# Patient Record
Sex: Male | Born: 1960 | Race: White | Hispanic: No | Marital: Married | Smoking: Former smoker
Health system: Southern US, Community
[De-identification: ages and names within clinical notes are randomized; demographics above are authoritative.]

## PROBLEM LIST (undated history)

## (undated) DIAGNOSIS — I471 Supraventricular tachycardia, unspecified: Secondary | ICD-10-CM

## (undated) DIAGNOSIS — Z973 Presence of spectacles and contact lenses: Secondary | ICD-10-CM

## (undated) DIAGNOSIS — M1990 Unspecified osteoarthritis, unspecified site: Secondary | ICD-10-CM

## (undated) DIAGNOSIS — K635 Polyp of colon: Secondary | ICD-10-CM

## (undated) DIAGNOSIS — J45909 Unspecified asthma, uncomplicated: Secondary | ICD-10-CM

## (undated) DIAGNOSIS — C4490 Unspecified malignant neoplasm of skin, unspecified: Secondary | ICD-10-CM

## (undated) DIAGNOSIS — T7840XA Allergy, unspecified, initial encounter: Secondary | ICD-10-CM

## (undated) HISTORY — DX: Allergy, unspecified, initial encounter: T78.40XA

## (undated) HISTORY — PX: COLONOSCOPY: SHX174

## (undated) HISTORY — DX: Polyp of colon: K63.5

## (undated) HISTORY — PX: CARDIAC CATHETERIZATION: SHX172

---

## 2011-02-11 ENCOUNTER — Ambulatory Visit: Payer: Self-pay | Admitting: Unknown Physician Specialty

## 2016-02-26 DIAGNOSIS — M545 Low back pain: Secondary | ICD-10-CM | POA: Diagnosis not present

## 2016-02-26 DIAGNOSIS — J4520 Mild intermittent asthma, uncomplicated: Secondary | ICD-10-CM | POA: Diagnosis not present

## 2016-02-26 DIAGNOSIS — L989 Disorder of the skin and subcutaneous tissue, unspecified: Secondary | ICD-10-CM | POA: Diagnosis not present

## 2016-04-09 DIAGNOSIS — D485 Neoplasm of uncertain behavior of skin: Secondary | ICD-10-CM | POA: Diagnosis not present

## 2016-04-09 DIAGNOSIS — D225 Melanocytic nevi of trunk: Secondary | ICD-10-CM | POA: Diagnosis not present

## 2016-04-13 DIAGNOSIS — C4490 Unspecified malignant neoplasm of skin, unspecified: Secondary | ICD-10-CM

## 2016-04-13 HISTORY — DX: Unspecified malignant neoplasm of skin, unspecified: C44.90

## 2016-05-12 DIAGNOSIS — L908 Other atrophic disorders of skin: Secondary | ICD-10-CM | POA: Diagnosis not present

## 2016-05-12 DIAGNOSIS — L578 Other skin changes due to chronic exposure to nonionizing radiation: Secondary | ICD-10-CM | POA: Diagnosis not present

## 2016-05-12 DIAGNOSIS — C4441 Basal cell carcinoma of skin of scalp and neck: Secondary | ICD-10-CM | POA: Diagnosis not present

## 2016-09-01 DIAGNOSIS — L918 Other hypertrophic disorders of the skin: Secondary | ICD-10-CM | POA: Diagnosis not present

## 2016-09-01 DIAGNOSIS — Z85828 Personal history of other malignant neoplasm of skin: Secondary | ICD-10-CM | POA: Diagnosis not present

## 2016-09-01 DIAGNOSIS — D225 Melanocytic nevi of trunk: Secondary | ICD-10-CM | POA: Diagnosis not present

## 2016-10-06 DIAGNOSIS — Z800 Family history of malignant neoplasm of digestive organs: Secondary | ICD-10-CM | POA: Diagnosis not present

## 2016-10-06 DIAGNOSIS — Z86010 Personal history of colonic polyps: Secondary | ICD-10-CM | POA: Diagnosis not present

## 2016-10-13 DIAGNOSIS — R220 Localized swelling, mass and lump, head: Secondary | ICD-10-CM | POA: Diagnosis not present

## 2016-10-23 DIAGNOSIS — J339 Nasal polyp, unspecified: Secondary | ICD-10-CM | POA: Diagnosis not present

## 2016-10-23 DIAGNOSIS — J329 Chronic sinusitis, unspecified: Secondary | ICD-10-CM | POA: Diagnosis not present

## 2016-10-23 DIAGNOSIS — J3489 Other specified disorders of nose and nasal sinuses: Secondary | ICD-10-CM | POA: Diagnosis not present

## 2016-11-03 ENCOUNTER — Encounter: Payer: Self-pay | Admitting: *Deleted

## 2016-11-05 DIAGNOSIS — K648 Other hemorrhoids: Secondary | ICD-10-CM | POA: Diagnosis not present

## 2016-11-05 DIAGNOSIS — K635 Polyp of colon: Secondary | ICD-10-CM | POA: Diagnosis not present

## 2016-11-05 DIAGNOSIS — D126 Benign neoplasm of colon, unspecified: Secondary | ICD-10-CM | POA: Diagnosis not present

## 2016-11-05 DIAGNOSIS — K5730 Diverticulosis of large intestine without perforation or abscess without bleeding: Secondary | ICD-10-CM | POA: Diagnosis not present

## 2016-11-05 DIAGNOSIS — Z86010 Personal history of colonic polyps: Secondary | ICD-10-CM | POA: Diagnosis not present

## 2016-11-06 NOTE — Discharge Instructions (Signed)
[facility] °[facility] °ENDOSCOPIC SINUS SURGERY °[facility] ° °What is Functional Endoscopic Sinus Surgery? ° The Surgery involves making the natural openings of the sinuses larger by removing the bony partitions that separate the sinuses from the nasal cavity.  The natural sinus lining is preserved as much as possible to allow the sinuses to resume normal function after the surgery.  In some patients nasal polyps (excessively swollen lining of the sinuses) may be removed to relieve obstruction of the sinus openings.  The surgery is performed through the nose using lighted scopes, which eliminates the need for incisions on the face.  A septoplasty is a different procedure which is sometimes performed with sinus surgery.  It involves straightening the boy partition that separates the two sides of your nose.  A crooked or deviated septum may need repair if is obstructing the sinuses or nasal airflow.  Turbinate reduction is also often performed during sinus surgery.  The turbinates are bony proturberances from the side walls of the nose which swell and can obstruct the nose in patients with sinus and allergy problems.  Their size can be surgically reduced to help relieve nasal obstruction. ° °What Can Sinus Surgery Do For Me? ° Sinus surgery can reduce the frequency of sinus infections requiring antibiotic treatment.  This can provide improvement in nasal congestion, post-nasal drainage, facial pressure and nasal obstruction.  Surgery will NOT prevent you from ever having an infection again, so it usually only for patients who get infections 4 or more times yearly requiring antibiotics, or for infections that do not clear with antibiotics.  It will not cure nasal allergies, so patients with allergies may still require medication to treat their allergies after surgery. Surgery may improve headaches related to sinusitis, however, some people will continue to  require medication to control sinus headaches related to allergies.  Surgery will do nothing for other forms of headache (migraine, tension or cluster). ° °What Are the Risks of Endoscopic Sinus Surgery? ° Current techniques allow surgery to be performed safely with little risk, however, there are rare complications that patients should be aware of.  Because the sinuses are located around the eyes, there is risk of eye injury, including blindness, though again, this would be quite rare. This is usually a result of bleeding behind the eye during surgery, which puts the vision oat risk, though there are treatments to protect the vision and prevent permanent disrupted by surgery causing a leak of the spinal fluid that surrounds the brain.  More serious complications would include bleeding inside the brain cavity or damage to the brain.  Again, all of these complications are uncommon, and spinal fluid leaks can be safely managed surgically if they occur.  The most common complication of sinus surgery is bleeding from the nose, which may require packing or cauterization of the nose.  Continued sinus have polyps may experience recurrence of the polyps requiring revision surgery.  Alterations of sense of smell or injury to the tear ducts are also rare complications.  ° °What is the Surgery Like, and what is the Recovery? ° The Surgery usually takes a couple of hours to perform, and is usually performed under a general anesthetic (completely asleep).  Patients are usually discharged home after a couple of hours.  Sometimes during surgery it is necessary to pack the nose to control bleeding, and the packing is left in place for 24 - 48 hours, and removed by your surgeon.    If a septoplasty was performed during the procedure, there is often a splint placed which must be removed after 5-7 days.   °Discomfort: Pain is usually mild to moderate, and can be controlled by prescription pain medication or acetaminophen (Tylenol).   Aspirin, Ibuprofen (Advil, Motrin), or Naprosyn (Aleve) should be avoided, as they can cause increased bleeding.  Most patients feel sinus pressure like they have a bad head cold for several days.  Sleeping with your head elevated can help reduce swelling and facial pressure, as can ice packs over the face.  A humidifier may be helpful to keep the mucous and blood from drying in the nose.  ° °Diet: There are no specific diet restrictions, however, you should generally start with clear liquids and a light diet of bland foods because the anesthetic can cause some nausea.  Advance your diet depending on how your stomach feels.  Taking your pain medication with food will often help reduce stomach upset which pain medications can cause. ° °Nasal Saline Irrigation: It is important to remove blood clots and dried mucous from the nose as it is healing.  This is done by having you irrigate the nose at least 3 - 4 times daily with a salt water solution.  We recommend using NeilMed Sinus Rinse (available at the drug store).  Fill the squeeze bottle with the solution, bend over a sink, and insert the tip of the squeeze bottle into the nose ½ of an inch.  Point the tip of the squeeze bottle towards the inside corner of the eye on the same side your irrigating.  Squeeze the bottle and gently irrigate the nose.  If you bend forward as you do this, most of the fluid will flow back out of the nose, instead of down your throat.   The solution should be warm, near body temperature, when you irrigate.   Each time you irrigate, you should use a full squeeze bottle.  ° °Note that if you are instructed to use Nasal Steroid Sprays at any time after your surgery, irrigate with saline BEFORE using the steroid spray, so you do not wash it all out of the nose. °Another product, Nasal Saline Gel (such as AYR Nasal Saline Gel) can be applied in each nostril 3 - 4 times daily to moisture the nose and reduce scabbing or crusting. ° °Bleeding:   Bloody drainage from the nose can be expected for several days, and patients are instructed to irrigate their nose frequently with salt water to help remove mucous and blood clots.  The drainage may be dark red or brown, though some fresh blood may be seen intermittently, especially after irrigation.  Do not blow you nose, as bleeding may occur. If you must sneeze, keep your mouth open to allow air to escape through your mouth. ° °If heavy bleeding occurs: Irrigate the nose with saline to rinse out clots, then spray the nose 3 - 4 times with Afrin Nasal Decongestant Spray.  The spray will constrict the blood vessels to slow bleeding.  Pinch the lower half of your nose shut to apply pressure, and lay down with your head elevated.  Ice packs over the nose may help as well. If bleeding persists despite these measures, you should notify your doctor.  Do not use the Afrin routinely to control nasal congestion after surgery, as it can result in worsening congestion and may affect healing.  ° ° ° °Activity: Return to work varies among patients. Most patients will be   out of work at least 5 - 7 days to recover.  Patient may return to work after they are off of narcotic pain medication, and feeling well enough to perform the functions of their job.  Patients must avoid heavy lifting (over 10 pounds) or strenuous physical for 2 weeks after surgery, so your employer may need to assign you to light duty, or keep you out of work longer if light duty is not possible.  NOTE: you should not drive, operate dangerous machinery, do any mentally demanding tasks or make any important legal or financial decisions while on narcotic pain medication and recovering from the general anesthetic.  °  °Call Your Doctor Immediately if You Have Any of the Following: °1. Bleeding that you cannot control with the above measures °2. Loss of vision, double vision, bulging of the eye or black eyes. °3. Fever over 101 degrees °4. Neck stiffness with  severe headache, fever, nausea and change in mental state. °You are always encourage to call anytime with concerns, however, please call with requests for pain medication refills during office hours. ° °Office Endoscopy: During follow-up visits your doctor will remove any packing or splints that may have been placed and evaluate and clean your sinuses endoscopically.  Topical anesthetic will be used to make this as comfortable as possible, though you may want to take your pain medication prior to the visit.  How often this will need to be done varies from patient to patient.  After complete recovery from the surgery, you may need follow-up endoscopy from time to time, particularly if there is concern of recurrent infection or nasal polyps. ° ° °General Anesthesia, Adult, Care After °These instructions provide you with information about caring for yourself after your procedure. Your health care provider may also give you more specific instructions. Your treatment has been planned according to current medical practices, but problems sometimes occur. Call your health care provider if you have any problems or questions after your procedure. °What can I expect after the procedure? °After the procedure, it is common to have: °· Vomiting. °· A sore throat. °· Mental slowness. ° °It is common to feel: °· Nauseous. °· Cold or shivery. °· Sleepy. °· Tired. °· Sore or achy, even in parts of your body where you did not have surgery. ° °Follow these instructions at home: °For at least 24 hours after the procedure: °· Do not: °? Participate in activities where you could fall or become injured. °? Drive. °? Use heavy machinery. °? Drink alcohol. °? Take sleeping pills or medicines that cause drowsiness. °? Make important decisions or sign legal documents. °? Take care of children on your own. °· Rest. °Eating and drinking °· If you vomit, drink water, juice, or soup when you can drink without vomiting. °· Drink enough fluid to  keep your urine clear or pale yellow. °· Make sure you have little or no nausea before eating solid foods. °· Follow the diet recommended by your health care provider. °General instructions °· Have a responsible adult stay with you until you are awake and alert. °· Return to your normal activities as told by your health care provider. Ask your health care provider what activities are safe for you. °· Take over-the-counter and prescription medicines only as told by your health care provider. °· If you smoke, do not smoke without supervision. °· Keep all follow-up visits as told by your health care provider. This is important. °Contact a health care provider if: °· You   continue to have nausea or vomiting at home, and medicines are not helpful. °· You cannot drink fluids or start eating again. °· You cannot urinate after 8-12 hours. °· You develop a skin rash. °· You have fever. °· You have increasing redness at the site of your procedure. °Get help right away if: °· You have difficulty breathing. °· You have chest pain. °· You have unexpected bleeding. °· You feel that you are having a life-threatening or urgent problem. °This information is not intended to replace advice given to you by your health care provider. Make sure you discuss any questions you have with your health care provider. °Document Released: 04/06/2000 Document Revised: 06/03/2015 Document Reviewed: 12/13/2014 °Elsevier Interactive Patient Education © 2018 [employer] ° °

## 2016-11-10 ENCOUNTER — Ambulatory Visit: Payer: 59 | Admitting: Anesthesiology

## 2016-11-10 ENCOUNTER — Ambulatory Visit
Admission: RE | Admit: 2016-11-10 | Discharge: 2016-11-10 | Disposition: A | Discharge status: Home / Self Care | Payer: 59 | Source: Ambulatory Visit | Attending: Otolaryngology | Admitting: Otolaryngology

## 2016-11-10 ENCOUNTER — Encounter
Admission: RE | Disposition: A | Discharge status: Home / Self Care | Payer: Self-pay | Source: Ambulatory Visit | Attending: Otolaryngology

## 2016-11-10 DIAGNOSIS — Z8261 Family history of arthritis: Secondary | ICD-10-CM | POA: Insufficient documentation

## 2016-11-10 DIAGNOSIS — J342 Deviated nasal septum: Secondary | ICD-10-CM | POA: Diagnosis not present

## 2016-11-10 DIAGNOSIS — J339 Nasal polyp, unspecified: Secondary | ICD-10-CM | POA: Insufficient documentation

## 2016-11-10 DIAGNOSIS — Z820 Family history of epilepsy and other diseases of the nervous system: Secondary | ICD-10-CM | POA: Insufficient documentation

## 2016-11-10 DIAGNOSIS — J320 Chronic maxillary sinusitis: Secondary | ICD-10-CM | POA: Diagnosis not present

## 2016-11-10 DIAGNOSIS — J323 Chronic sphenoidal sinusitis: Secondary | ICD-10-CM | POA: Diagnosis not present

## 2016-11-10 DIAGNOSIS — J338 Other polyp of sinus: Secondary | ICD-10-CM | POA: Diagnosis not present

## 2016-11-10 DIAGNOSIS — Z87891 Personal history of nicotine dependence: Secondary | ICD-10-CM | POA: Diagnosis not present

## 2016-11-10 DIAGNOSIS — J3489 Other specified disorders of nose and nasal sinuses: Secondary | ICD-10-CM | POA: Insufficient documentation

## 2016-11-10 DIAGNOSIS — Z823 Family history of stroke: Secondary | ICD-10-CM | POA: Insufficient documentation

## 2016-11-10 DIAGNOSIS — Z85828 Personal history of other malignant neoplasm of skin: Secondary | ICD-10-CM | POA: Insufficient documentation

## 2016-11-10 DIAGNOSIS — J322 Chronic ethmoidal sinusitis: Secondary | ICD-10-CM | POA: Diagnosis not present

## 2016-11-10 DIAGNOSIS — J324 Chronic pansinusitis: Secondary | ICD-10-CM

## 2016-11-10 DIAGNOSIS — J329 Chronic sinusitis, unspecified: Secondary | ICD-10-CM | POA: Diagnosis not present

## 2016-11-10 DIAGNOSIS — J321 Chronic frontal sinusitis: Secondary | ICD-10-CM | POA: Diagnosis not present

## 2016-11-10 DIAGNOSIS — J45909 Unspecified asthma, uncomplicated: Secondary | ICD-10-CM | POA: Diagnosis not present

## 2016-11-10 DIAGNOSIS — Z833 Family history of diabetes mellitus: Secondary | ICD-10-CM | POA: Diagnosis not present

## 2016-11-10 HISTORY — PX: ETHMOIDECTOMY: SHX5197

## 2016-11-10 HISTORY — PX: IMAGE GUIDED SINUS SURGERY: SHX6570

## 2016-11-10 HISTORY — DX: Unspecified osteoarthritis, unspecified site: M19.90

## 2016-11-10 HISTORY — DX: Unspecified asthma, uncomplicated: J45.909

## 2016-11-10 HISTORY — PX: FRONTAL SINUS EXPLORATION: SHX6591

## 2016-11-10 HISTORY — DX: Presence of spectacles and contact lenses: Z97.3

## 2016-11-10 HISTORY — PX: MAXILLARY ANTROSTOMY: SHX2003

## 2016-11-10 HISTORY — DX: Unspecified malignant neoplasm of skin, unspecified: C44.90

## 2016-11-10 HISTORY — PX: SPHENOIDECTOMY: SHX2421

## 2016-11-10 SURGERY — SINUS SURGERY, WITH IMAGING GUIDANCE
Anesthesia: General | Wound class: Clean Contaminated

## 2016-11-10 MED ORDER — PREDNISONE 10 MG (21) PO TBPK: ORAL_TABLET | ORAL | 0 refills | Status: DC

## 2016-11-10 MED ORDER — ROCURONIUM BROMIDE 100 MG/10ML IV SOLN
INTRAVENOUS | Status: DC | PRN
  Administered 2016-11-10: 30 mg via INTRAVENOUS

## 2016-11-10 MED ORDER — FENTANYL CITRATE (PF) 100 MCG/2ML IJ SOLN
INTRAMUSCULAR | Status: DC | PRN
  Administered 2016-11-10: 50 ug via INTRAVENOUS
  Administered 2016-11-10: 100 ug via INTRAVENOUS
  Administered 2016-11-10: 50 ug via INTRAVENOUS
  Administered 2016-11-10: 25 ug via INTRAVENOUS
  Administered 2016-11-10 (×2): 50 ug via INTRAVENOUS

## 2016-11-10 MED ORDER — CEFDINIR 300 MG PO CAPS: 300.0000 mg | ORAL_CAPSULE | Freq: Two times a day (BID) | ORAL | 0 refills | Status: DC

## 2016-11-10 MED ORDER — OXYMETAZOLINE HCL 0.05 % NA SOLN
NASAL | Status: DC | PRN
  Administered 2016-11-10: 6 via TOPICAL

## 2016-11-10 MED ORDER — DEXAMETHASONE SODIUM PHOSPHATE 4 MG/ML IJ SOLN
INTRAMUSCULAR | Status: DC | PRN
  Administered 2016-11-10: 10 mg via INTRAVENOUS

## 2016-11-10 MED ORDER — ACETAMINOPHEN 10 MG/ML IV SOLN
INTRAVENOUS | Status: DC | PRN
  Administered 2016-11-10: 1000 mg via INTRAVENOUS

## 2016-11-10 MED ORDER — OXYCODONE HCL 5 MG/5ML PO SOLN
5.0000 mg | Freq: Once | ORAL | Status: AC | PRN
  Administered 2016-11-10: 5 mg via ORAL

## 2016-11-10 MED ORDER — PROMETHAZINE HCL 25 MG/ML IJ SOLN: 6.2500 mg | INTRAMUSCULAR | Status: DC | PRN

## 2016-11-10 MED ORDER — LIDOCAINE-EPINEPHRINE 1 %-1:100000 IJ SOLN
INTRAMUSCULAR | Status: DC | PRN
  Administered 2016-11-10: 8 mL

## 2016-11-10 MED ORDER — HYDROCODONE-ACETAMINOPHEN 5-325 MG PO TABS: 1.0000 | ORAL_TABLET | Freq: Four times a day (QID) | ORAL | 0 refills | Status: DC | PRN

## 2016-11-10 MED ORDER — GLYCOPYRROLATE 0.2 MG/ML IJ SOLN
INTRAMUSCULAR | Status: DC | PRN
  Administered 2016-11-10: 0.1 mg via INTRAVENOUS

## 2016-11-10 MED ORDER — OXYCODONE HCL 5 MG PO TABS: 5.0000 mg | ORAL_TABLET | Freq: Once | ORAL | Status: AC | PRN

## 2016-11-10 MED ORDER — LACTATED RINGERS IV SOLN
INTRAVENOUS | Status: DC
  Administered 2016-11-10 (×2): via INTRAVENOUS

## 2016-11-10 MED ORDER — LIDOCAINE HCL (CARDIAC) 20 MG/ML IV SOLN
INTRAVENOUS | Status: DC | PRN
  Administered 2016-11-10: 50 mg via INTRAVENOUS

## 2016-11-10 MED ORDER — PROPOFOL 10 MG/ML IV BOLUS
INTRAVENOUS | Status: DC | PRN
  Administered 2016-11-10: 170 mg via INTRAVENOUS

## 2016-11-10 MED ORDER — FENTANYL CITRATE (PF) 100 MCG/2ML IJ SOLN: 25.0000 ug | INTRAMUSCULAR | Status: DC | PRN

## 2016-11-10 MED ORDER — MIDAZOLAM HCL 5 MG/5ML IJ SOLN
INTRAMUSCULAR | Status: DC | PRN
  Administered 2016-11-10: 2 mg via INTRAVENOUS

## 2016-11-10 MED ORDER — OXYMETAZOLINE HCL 0.05 % NA SOLN
1.0000 | Freq: Two times a day (BID) | NASAL | Status: DC
  Administered 2016-11-10: 1 via NASAL

## 2016-11-10 MED ORDER — ONDANSETRON HCL 4 MG/2ML IJ SOLN
INTRAMUSCULAR | Status: DC | PRN
  Administered 2016-11-10: 4 mg via INTRAVENOUS

## 2016-11-10 SURGICAL SUPPLY — 21 items
BATTERY INSTRU NAVIGATION (MISCELLANEOUS) ×9 IMPLANT
CANISTER SUCT 1200ML W/VALVE (MISCELLANEOUS) ×3 IMPLANT
COAG SUCT 10F 3.5MM HAND CTRL (MISCELLANEOUS) ×3 IMPLANT
DRAPE HEAD BAR (DRAPES) ×3 IMPLANT
DRESSING NASL FOAM PST OP SINU (MISCELLANEOUS) ×4 IMPLANT
DRSG NASAL FOAM POST OP SINU (MISCELLANEOUS) ×6
GLOVE BIO SURGEON STRL SZ7.5 (GLOVE) ×6 IMPLANT
IV NS 500ML (IV SOLUTION) ×1
IV NS 500ML BAXH (IV SOLUTION) ×2 IMPLANT
KIT ROOM TURNOVER OR (KITS) ×3 IMPLANT
NS IRRIG 500ML POUR BTL (IV SOLUTION) ×3 IMPLANT
PACK DRAPE NASAL/ENT (PACKS) ×3 IMPLANT
PAD GROUND ADULT SPLIT (MISCELLANEOUS) ×3 IMPLANT
PATTIES SURGICAL .5 X3 (DISPOSABLE) ×6 IMPLANT
SHAVER DIEGO BLD STD TYPE A (BLADE) ×3 IMPLANT
SOL ANTI-FOG 6CC FOG-OUT (MISCELLANEOUS) ×2 IMPLANT
SOL FOG-OUT ANTI-FOG 6CC (MISCELLANEOUS) ×1
SYRINGE 10CC LL (SYRINGE) ×3 IMPLANT
TRACKER CRANIALMASK (MASK) ×3 IMPLANT
TUBING DECLOG MULTIDEBRIDER (TUBING) ×3 IMPLANT
WATER STERILE IRR 250ML POUR (IV SOLUTION) ×3 IMPLANT

## 2016-11-10 NOTE — Op Note (Signed)
11/10/2016  1:07 PM    Kyle Kim  [id number]   Pre-Op Diagnosis:  NASAL POLYPS, CHRONIC PANSINUSITIS, DEVIATED SEPTUM Post-op Diagnosis: NASAL POLYPS, CHRONIC PANSINUSITIS, DEVIATED SEPTUM  Procedure:  1)  Image Guided Sinus Surgery,   2)  Bilateral Endoscopic Maxillary Antrostomy with Tissue Removal   3)  Bilateral Frontal Sinusotomy   4)  Bilateral Total Ethmoidectomy   5)  Bilateral Sphenoidotomy with tissue removal    Surgeon:  Riley Nearing  Anesthesia:  General endotracheal  EBL:  628BT  Complications:  None  Findings: Massive polyps filling the nasal cavity bilaterally, obstructing the Maxillary, ethmoid, front and sphenoid sinuses. Mild septal deviation with left septal spur. Septoplasty was not deemed necessary  Procedure: After the patient was identified in holding and the benefits of the procedure were reviewed as well as the consent and risks, the patient was taken to the operating room and with the patient in a comfortable supine position,  general orotracheal anesthesia was induced without difficulty.  A proper time-out was performed.  The Stryker image guidance system was set up and calibrated in the normal fashion and felt to be acceptable.  Next 1% Xylocaine with 1:100,000 epinephrine was infiltrated into the inferior turbinates, septum, and anterior middle turbinates bilaterally.  Several minutes were allowed for this to take effect.  Cottoniod pledgets soaked in Afrin were placed into both nasal cavities and left while the patient was prepped and draped in the standard fashion. The image guided suction was calibrated and used to inspect known points in the nasal cavity to assess accuracy of the image guided system. Accuracy was felt to be excellent.   The left nasal cavity was inspected endoscopically, and large polyps were debrided with cup forceps and the microdebrider, until the middle turbinate could be properly visualized. The left middle turbinate was  medialized and the uncinate process then resected with through-cutting forceps as well as the microdebrider. In this fashion the uncinate was completely removed along with soft tissue and bone of the medial wall of the maxillary sinus to create a large patent maxillary antrostomy, dissecting it open back to the secondary os, and removing obstructing polypoid tissue.. The left maxillary sinus was suctioned to clear secretions.   Next the left anterior ethmoid sinuses were dissected beginning inferomedially, entering the ethmoid bulla. Thru cut forceps were used to open the anterior ethmoids. The microdebrider was used as needed to resect large polyps and  trim loose mucosal edges.  Next the basal lamella was entered and, working back in a sequential fashion through the ethmoid air cells, the ethmoid sinuses were dissected to the posterior ethmoid sinuses, utilizing the image guided suction and the whole time to reassess the anatomy frequently. Thru cutting forceps were used for this dissection, with some limited microdebrider use to remove polyps. Care was taken to avoid injury to the lamina papyracea laterally and the skull base superiorly.   Dissection proceeded anteriorly and superiorly into the left frontal recess which was dissected utilizing a 30 scope and frontal curved instruments. The curved image guided suction was used during this dissection to frequently reassess the anatomy on the CT scan. The frontal recess was dissected, removing polypoid tissue until a suction could be passed up into the region of the frontal sinus.   Next the scope was passed medial to the middle turbinate and the left sphenoid recess inspected. With the assistance of the image guided system, the sphenoid sinus was carefully entered through some thin bone at  the anterior face of the spenoid, medial to the superior turbinate, and widely opened with through-cutting sphenoid punch forceps, removing polypoid tissue. Mucus was  suctioned from the sphenoid sinus.  Attention was then turned to the right side where the same procedure was performed, opening the maxillary sinus, ethmoid cavities, sphenoid sinus and the frontal recess in the same fashion as described above. There appeared to be a collection of fungal debris in the right posterior ethmoids, and this was all removed.   The nasal cavity was inspected and there was a good airway at the completion of the procedure. Some septal deviation was noted, including a small left septal spur, but with the polyps removed, this was not enough to cause obstruction, so septoplasty was not felt necessary.   The nose was suctioned and inspected. The maxillary sinuses were irrigated with saline. Bleeding was controlled with the suction cautery. Stammberger absorbable sinus packing was then placed in the ethmoid cavities bilaterally.   The patient was then returned to the anesthesiologist for awakening and taken to recovery room in good condition postoperatively.  Disposition:   PACU and d/c home  Plan: Ice, elevation, narcotic analgesia and prophylactic antibiotics. Begin sinus irrigations with saline tomrrow, irrigating 3-4 times daily. Return to the office in 7 days.  Return to work in 7-10 days, no strenuous activities for two weeks.   Riley Nearing 11/10/2016 1:07 PM

## 2016-11-10 NOTE — Anesthesia Procedure Notes (Signed)
Procedure Name: Intubation Date/Time: 11/10/2016 9:42 AM Performed by: Mayme Genta Pre-anesthesia Checklist: Patient identified, Emergency Drugs available, Suction available, Patient being monitored and Timeout performed Patient Re-evaluated:Patient Re-evaluated prior to induction Oxygen Delivery Method: Circle system utilized Preoxygenation: Pre-oxygenation with 100% oxygen Induction Type: IV induction Ventilation: Mask ventilation without difficulty Laryngoscope Size: Miller and 3 Grade View: Grade I Tube type: Oral Rae Tube size: 7.5 mm Number of attempts: 1 Placement Confirmation: ETT inserted through vocal cords under direct vision,  positive ETCO2 and breath sounds checked- equal and bilateral Tube secured with: Tape Dental Injury: Teeth and Oropharynx as per pre-operative assessment

## 2016-11-10 NOTE — Anesthesia Preprocedure Evaluation (Addendum)
Anesthesia Evaluation  Patient identified by MRN, date of birth, ID band Patient awake    Reviewed: Allergy & Precautions, NPO status , Patient's Chart, lab work & pertinent test results  Airway Mallampati: II  TM Distance: >3 FB Neck ROM: Full    Dental no notable dental hx.    Pulmonary neg pulmonary ROS, asthma , former smoker,  Lungs clear today. Albuterol used preoperatively prophylactically   Pulmonary exam normal breath sounds clear to auscultation       Cardiovascular negative cardio ROS Normal cardiovascular exam Rhythm:Regular Rate:Normal  Pt with history of PVCs after colonoscopy. 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 No symptoms with PVCs   Neuro/Psych negative neurological ROS  negative psych ROS   GI/Hepatic negative GI ROS, Neg liver ROS,   Endo/Other  negative endocrine ROS  Renal/GU negative Renal ROS  negative genitourinary   Musculoskeletal negative musculoskeletal ROS (+)   Abdominal   Peds negative pediatric ROS (+)  Hematology negative hematology ROS (+)   Anesthesia Other Findings   Reproductive/Obstetrics negative OB ROS                            Anesthesia Physical Anesthesia Plan  ASA: II  Anesthesia Plan: General   Post-op Pain Management:    Induction: Intravenous  PONV Risk Score and Plan:   Airway Management Planned:   Additional Equipment:   Intra-op Plan:   Post-operative Plan: Extubation in OR  Informed Consent: I have reviewed the patients History and Physical, chart, labs and discussed the procedure including the risks, benefits and alternatives for the proposed anesthesia with the patient or authorized representative who has indicated his/her understanding and acceptance.   Dental advisory given  Plan Discussed with: CRNA  Anesthesia Plan Comments:         Anesthesia Quick Evaluation

## 2016-11-10 NOTE — H&P (Signed)
History and physical reviewed and will be scanned in later. Had PVC's after recent colonoscopy, but anesthesia has reviewed an EKG and feels he is stable for surgery. All questions regarding the procedure answered, and patient (or family if a child) expressed understanding of the procedure.  Kyle Kim S @TODAY @

## 2016-11-10 NOTE — Transfer of Care (Signed)
Immediate Anesthesia Transfer of Care Note  Patient: Kyle Kim  Procedure(s) Performed: IMAGE GUIDED SINUS SURGERY (N/A ) MAXILLARY ANTROSTOMY WITH TISSUE REMOVAL (Bilateral ) ETHMOIDECTOMY (Bilateral ) FRONTAL SINUS EXPLORATION (Bilateral ) SPHENOIDECTOMY (Bilateral )  Patient Location: PACU  Anesthesia Type: General  Level of Consciousness: awake, alert  and patient cooperative  Airway and Oxygen Therapy: Patient Spontanous Breathing and Patient connected to supplemental oxygen  Post-op Assessment: Post-op Vital signs reviewed, Patient's Cardiovascular Status Stable, Respiratory Function Stable, Patent Airway and No signs of Nausea or vomiting  Post-op Vital Signs: Reviewed and stable  Complications: No apparent anesthesia complications

## 2016-11-10 NOTE — Anesthesia Postprocedure Evaluation (Signed)
Anesthesia Post Note  Patient: Kyle Kim  Procedure(s) Performed: IMAGE GUIDED SINUS SURGERY (N/A ) MAXILLARY ANTROSTOMY WITH TISSUE REMOVAL (Bilateral ) ETHMOIDECTOMY (Bilateral ) FRONTAL SINUS EXPLORATION (Bilateral ) SPHENOIDECTOMY (Bilateral )  Patient location during evaluation: PACU Anesthesia Type: General Level of consciousness: awake and alert Pain management: pain level controlled Vital Signs Assessment: post-procedure vital signs reviewed and stable Respiratory status: spontaneous breathing, nonlabored ventilation, respiratory function stable and patient connected to nasal cannula oxygen Cardiovascular status: blood pressure returned to baseline and stable Postop Assessment: no apparent nausea or vomiting Anesthetic complications: no    Corrion Stirewalt C

## 2016-11-11 ENCOUNTER — Encounter: Payer: Self-pay | Admitting: Otolaryngology

## 2016-11-12 LAB — SURGICAL PATHOLOGY

## 2016-11-16 DIAGNOSIS — J339 Nasal polyp, unspecified: Secondary | ICD-10-CM | POA: Diagnosis not present

## 2016-12-09 DIAGNOSIS — J339 Nasal polyp, unspecified: Secondary | ICD-10-CM | POA: Diagnosis not present

## 2017-01-06 DIAGNOSIS — J301 Allergic rhinitis due to pollen: Secondary | ICD-10-CM | POA: Diagnosis not present

## 2017-01-06 DIAGNOSIS — J339 Nasal polyp, unspecified: Secondary | ICD-10-CM | POA: Diagnosis not present

## 2017-01-18 DIAGNOSIS — Z23 Encounter for immunization: Secondary | ICD-10-CM | POA: Diagnosis not present

## 2017-03-10 DIAGNOSIS — J301 Allergic rhinitis due to pollen: Secondary | ICD-10-CM | POA: Diagnosis not present

## 2017-03-10 DIAGNOSIS — J339 Nasal polyp, unspecified: Secondary | ICD-10-CM | POA: Diagnosis not present

## 2017-05-13 DIAGNOSIS — M545 Low back pain: Secondary | ICD-10-CM | POA: Diagnosis not present

## 2017-05-20 DIAGNOSIS — M545 Low back pain: Secondary | ICD-10-CM | POA: Diagnosis not present

## 2017-06-17 DIAGNOSIS — J329 Chronic sinusitis, unspecified: Secondary | ICD-10-CM | POA: Diagnosis not present

## 2017-06-17 DIAGNOSIS — J339 Nasal polyp, unspecified: Secondary | ICD-10-CM | POA: Diagnosis not present

## 2017-06-17 DIAGNOSIS — J301 Allergic rhinitis due to pollen: Secondary | ICD-10-CM | POA: Diagnosis not present

## 2017-06-17 DIAGNOSIS — J324 Chronic pansinusitis: Secondary | ICD-10-CM | POA: Diagnosis not present

## 2017-07-29 DIAGNOSIS — J329 Chronic sinusitis, unspecified: Secondary | ICD-10-CM | POA: Diagnosis not present

## 2017-07-29 DIAGNOSIS — J301 Allergic rhinitis due to pollen: Secondary | ICD-10-CM | POA: Diagnosis not present

## 2017-07-29 DIAGNOSIS — J339 Nasal polyp, unspecified: Secondary | ICD-10-CM | POA: Diagnosis not present

## 2017-07-29 DIAGNOSIS — J324 Chronic pansinusitis: Secondary | ICD-10-CM | POA: Diagnosis not present

## 2017-08-25 DIAGNOSIS — J301 Allergic rhinitis due to pollen: Secondary | ICD-10-CM | POA: Diagnosis not present

## 2017-08-25 DIAGNOSIS — J329 Chronic sinusitis, unspecified: Secondary | ICD-10-CM | POA: Diagnosis not present

## 2017-11-02 DIAGNOSIS — Z23 Encounter for immunization: Secondary | ICD-10-CM | POA: Diagnosis not present

## 2017-11-25 DIAGNOSIS — J301 Allergic rhinitis due to pollen: Secondary | ICD-10-CM | POA: Diagnosis not present

## 2017-11-25 DIAGNOSIS — J329 Chronic sinusitis, unspecified: Secondary | ICD-10-CM | POA: Diagnosis not present

## 2020-02-14 ENCOUNTER — Encounter: Payer: Self-pay | Admitting: Cardiovascular Disease

## 2020-03-13 ENCOUNTER — Ambulatory Visit (INDEPENDENT_AMBULATORY_CARE_PROVIDER_SITE_OTHER): Payer: No Typology Code available for payment source

## 2020-03-13 ENCOUNTER — Ambulatory Visit (INDEPENDENT_AMBULATORY_CARE_PROVIDER_SITE_OTHER): Payer: No Typology Code available for payment source | Admitting: Cardiovascular Disease

## 2020-03-13 ENCOUNTER — Other Ambulatory Visit: Payer: Self-pay

## 2020-03-13 ENCOUNTER — Encounter: Payer: Self-pay | Admitting: Cardiovascular Disease

## 2020-03-13 VITALS — BP 122/78 | HR 74 | Ht 70.0 in | Wt 224.0 lb

## 2020-03-13 DIAGNOSIS — I493 Ventricular premature depolarization: Secondary | ICD-10-CM

## 2020-03-13 DIAGNOSIS — M5440 Lumbago with sciatica, unspecified side: Secondary | ICD-10-CM

## 2020-03-13 DIAGNOSIS — R002 Palpitations: Secondary | ICD-10-CM | POA: Diagnosis not present

## 2020-03-13 DIAGNOSIS — G8929 Other chronic pain: Secondary | ICD-10-CM

## 2020-03-13 DIAGNOSIS — Z87891 Personal history of nicotine dependence: Secondary | ICD-10-CM

## 2020-03-13 NOTE — Progress Notes (Signed)
Cardiology Office Note  Date:  03/13/2020   ID:  Kyle Kim, DOB 01-09-1961, MRN [id number]  PCP:  Kyle Pink, MD   Chief Complaint  Patient presents with  . New Patient (Initial Visit)    Referred by PCP for palpitations. Meds reviewed verbally with patient.     HPI:  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  Recent lipid panel not available "cholesterol a little high" per primary care  Chronic back pain, takes aspirin, NSAIDs daily  Rarely feels his palpitations Feels he has had them for many years Denies any chest pain or shortness of breath on exertion No prior cardiac work-up  On discussion of family history Uncle, mothers side: CAD, MI died in 75s  EKG personally reviewed by myself on todays visit Shows normal sinus rhythm rate 74 bpm PVCs in a trigeminal pattern  Records requested from primary care Lab work reviewed, total cholesterol 199, LDL 118, normal LFTs, creatinine 0.81 TSH 2.99  PMH:   has a past medical history of Arthritis, Asthma, Skin cancer (04/13/2016), and Wears contact lenses.  PSH:    Past Surgical History:  Procedure Laterality Date  . COLONOSCOPY    . ETHMOIDECTOMY Bilateral 11/10/2016   Procedure: ETHMOIDECTOMY;  Surgeon: Clyde Canterbury, MD;  Location: [facility];  Service: ENT;  Laterality: Bilateral;  . FRONTAL SINUS EXPLORATION Bilateral 11/10/2016   Procedure: FRONTAL SINUS EXPLORATION;  Surgeon: Clyde Canterbury, MD;  Location: [facility] [place];  Service: ENT;  Laterality: Bilateral;  . IMAGE GUIDED SINUS SURGERY N/A 11/10/2016   Procedure: IMAGE GUIDED SINUS SURGERY;  Surgeon: Clyde Canterbury, MD;  Location: [facility];  Service: ENT;  Laterality: N/A;  GAVE  DISK TO [facility] 10-23  . MAXILLARY ANTROSTOMY Bilateral 11/10/2016   Procedure: MAXILLARY ANTROSTOMY WITH TISSUE REMOVAL;  Surgeon: Clyde Canterbury, MD;  Location: [facility];  Service: ENT;  Laterality: Bilateral;  . SPHENOIDECTOMY Bilateral 11/10/2016   Procedure: SPHENOIDECTOMY;  Surgeon: Clyde Canterbury, MD;  Location: [facility];  Service: ENT;  Laterality: Bilateral;    Current Outpatient Medications  Medication Sig Dispense Refill  . acetaminophen (TYLENOL) 325 MG tablet Take 650 mg by mouth every 6 (six) hours as needed.    Marland Kitchen albuterol (PROVENTIL HFA;VENTOLIN HFA) 108 (90 Base) MCG/ACT inhaler Inhale into the lungs every 6 (six) hours as needed for wheezing or shortness of breath.    Marland Kitchen aspirin 325 MG EC tablet Take 325 mg by mouth in the morning and at bedtime.    . cyclobenzaprine (FLEXERIL) 5 MG tablet Take 5 mg by mouth 3 (three) times daily as needed for muscle spasms.    . fluticasone (FLONASE) 50 MCG/ACT nasal spray INSTILL 2 SPRAYS IN EACH NOSTRIL TWICE DAILY    . montelukast (SINGULAIR) 10 MG tablet Take 10 mg by mouth daily.    . sildenafil (VIAGRA) 50 MG tablet TAKE 1 TABLET BY MOUTH ONCE DAILY AS NEEDED 30 MINUTES TO 1 HOUR PRIOR TO NEED     No current facility-administered medications for this visit.     Allergies:   Pecan nut (diagnostic)   Social History:  The patient  reports that he quit smoking about 14 years ago. His smoking use included cigarettes. He has never used smokeless tobacco.  He reports current alcohol use of about 14.0 standard drinks of alcohol per week. He reports that he does not use drugs.   Family History:   family history is not on file.    Review of Systems: Review of Systems  Constitutional: Negative.   HENT: Negative.   Respiratory: Negative.   Cardiovascular: Positive for palpitations.  Gastrointestinal: Negative.   Musculoskeletal: Negative.   Neurological: Negative.   Psychiatric/Behavioral: Negative.   All other  systems reviewed and are negative.    PHYSICAL EXAM: VS:  BP 122/78 (BP Location: Right Arm, Patient Position: Sitting, Cuff Size: Normal)   Pulse 74   Ht 5\' 10"  (1.778 m)   Wt 224 lb (101.6 kg)   SpO2 97%   BMI 32.14 kg/m  , BMI Body mass index is 32.14 kg/m. GEN: Well nourished, well developed, in no acute distress HEENT: normal Neck: no JVD, carotid bruits, or masses Cardiac: RRR; no murmurs, rubs, or gallops,no edema  Respiratory:  clear to auscultation bilaterally, normal work of breathing GI: soft, nontender, nondistended, + BS MS: no deformity or atrophy Skin: warm and dry, no rash Neuro:  Strength and sensation are intact Psych: euthymic mood, full affect   Recent Labs: No results found for requested labs within last 8760 hours.    Lipid Panel No results found for: CHOL, HDL, LDLCALC, TRIG    Wt Readings from Last 3 Encounters:  03/13/20 224 lb (101.6 kg)  11/10/16 207 lb (93.9 kg)      ASSESSMENT AND PLAN:  Problem List Items Addressed This Visit   None   Visit Diagnoses    Frequent PVCs    -  Primary   Relevant Medications   aspirin 325 MG EC tablet   sildenafil (VIAGRA) 50 MG tablet   Other Relevant Orders   EKG 12-Lead   LONG TERM MONITOR (3-14 DAYS)   ECHOCARDIOGRAM COMPLETE   Palpitations       Relevant Orders   EKG 12-Lead   LONG TERM MONITOR (3-14 DAYS)   ECHOCARDIOGRAM COMPLETE   Former smoker       Chronic low back pain with sciatica, sciatica laterality unspecified, unspecified back pain laterality       Relevant Medications   aspirin 325 MG EC tablet   cyclobenzaprine (FLEXERIL) 5 MG tablet     PVCs/palpitations Frequent PVCs noted on EKG today with high burden Also with symptoms of palpitations Holter monitor ordered to determine PVC burden We will hold off on adding beta-blockers at this time until data available -Echocardiogram ordered given high PVC burden to rule out structural heart disease  Former smoker Reports he  quit over 10 years ago, but did smoke for quite some time Risk stratification studies discussed with him, discussed coronary calcium scoring He will call us if he would like this ordered  Chronic low back pain -Discussed concern with taking high-dose aspirin daily, 325 Recommend he talk with primary care concerning other options Perhaps he could try Celebrex or meloxicam if felt appropriate    Total encounter time more than 60 minutes  Greater than 50% was spent in counseling and coordination of care with the patient  Patient seen in consultation for Hendricks Kim and will be referred back to her office for ongoing care of the issues detailed above   Signed, Esmond Plants, M.D., Ph.D. [place], [place]

## 2020-03-13 NOTE — Patient Instructions (Addendum)
Medication Instructions:  No changes  Lab work: No new labs needed  Testing/Procedures:  Consider a CT coronary calcium score (call office if you would like one order)   3 day zio holter monitor for frequent PVCs   Echocardiogram for frequent PVCs  Your physician has requested that you have an echocardiogram. Echocardiography is a painless test that uses sound waves to create images of your heart. It provides your doctor with information about the size and shape of your heart and how well your heart's chambers and valves are working. This procedure takes approximately one hour. There are no restrictions for this procedure.  There is a possibility that an IV may need to be started during your test to inject an image enhancing agent. This is done to obtain more optimal pictures of your heart. Therefore we ask that you do at least drink some water prior to coming in to hydrate your veins.   Heart monitor (Zio patch) for 2 weeks (14 days)   Your physician has recommended that you wear a Zio monitor. This monitor is a medical device that records the heart's electrical activity. Doctors most often use these monitors to diagnose arrhythmias. Arrhythmias are problems with the speed or rhythm of the heartbeat. The monitor is a small device applied to your chest. You can wear one while you do your normal daily activities. While wearing this monitor if you have any symptoms to push the button and record what you felt. Once you have worn this monitor for the period of time provider prescribed (Usually 14 days), you will return the monitor device in the postage paid box. Once it is returned they will download the data collected and provide [place] with a report which the provider will then review and we will call you with those results. Important tips:  1. Avoid showering during the first 24 hours of wearing the monitor. 2. Avoid excessive sweating to help maximize wear time. 3. Do not submerge the device,  no hot tubs, and no swimming pools. 4. Keep any lotions or oils away from the patch. 5. After 24 hours you may shower with the patch on. Take brief showers with your back facing the shower head.  6. Do not remove patch once it has been placed because that will interrupt data and decrease adhesive wear time. 7. Push the button when you have any symptoms and write down what you were feeling. 8. Once you have completed wearing your monitor, remove and place into box which has postage paid and place in your outgoing mailbox.  9. If for some reason you have misplaced your box then call our office and we can provide another box and/or mail it off for you.       Follow-Up: At [facility], you and your health needs are our priority.  As part of our continuing mission to provide you with exceptional heart care, we have created designated Provider Care Teams.  These Care Teams include your primary Cardiologist (physician) and Advanced Practice Providers (APPs -  Physician Assistants and Nurse Practitioners) who all work together to provide you with the care you need, when you need it.  . You will need a follow up appointment as needed  . Providers on your designated Care Team:   . Murray Hodgkins, NP . Christell Faith, PA-C . Marrianne Mood, PA-C

## 2020-03-19 ENCOUNTER — Other Ambulatory Visit: Payer: Self-pay | Admitting: Cardiovascular Disease

## 2020-03-19 DIAGNOSIS — I493 Ventricular premature depolarization: Secondary | ICD-10-CM

## 2020-03-20 DIAGNOSIS — I493 Ventricular premature depolarization: Secondary | ICD-10-CM | POA: Diagnosis not present

## 2020-03-29 ENCOUNTER — Telehealth: Payer: Self-pay | Admitting: Physician Assistant

## 2020-03-29 MED ORDER — METOPROLOL TARTRATE 25 MG PO TABS: 25.0000 mg | ORAL_TABLET | Freq: Two times a day (BID) | ORAL | 0 refills | Status: DC

## 2020-03-29 NOTE — Telephone Encounter (Signed)
° °  Zio [employer] called the answering service after-hours today. Chart reviewed. Pt seen in office 03/13/20 with palpitations without other concerning symptoms. No labs available in Epic for review but per Dr. Donivan Scull note, "Lab work reviewed, total cholesterol 199, LDL 118, normal LFTs, creatinine 0.81 TSH 2.99." 2D echo planned. IR[employer] called to notify upon end of report patient had high burden of NSVT, 126 episodes, max 8 beats in length, no longer sustained events. Patient had only 1 diary entry per rep corresponding to ventricular bigeminy and triplet. Average HR 78 bpm, lowest HR 50bpm (10:45pm) and max 193bpm. BP 122/78 and HR 74 by last OV. I spoke with Dr. Meda Coffee regarding these results. She suggests starting metoprolol 25mg  BID. I spoke with patient and he does report he also had Covid while wearing monitor - as a result he was laying low and not doing as much, and therefore not really feeling many palpitations. The diary entry was when he was outside and felt his heart thumping briefly. No dizziness, syncope, chest pain. Otherwise feeling well. I discussed findings with patient and per our discussion sent in 30 day rx of metoprolol tartrate to requested pharmacy.  Will route this message to ordering provider Dr. Rockey Situ and his nurse to coordinate further actions based on this report. Patient was under impression he had OV this week but I only see appt for echo. Told pt to expect call next week. Warning sx reviewed. The patient verbalized understanding and gratitude.   Kyle Pitter, PA-C

## 2020-04-03 ENCOUNTER — Ambulatory Visit (INDEPENDENT_AMBULATORY_CARE_PROVIDER_SITE_OTHER): Payer: No Typology Code available for payment source

## 2020-04-03 ENCOUNTER — Other Ambulatory Visit: Payer: Self-pay

## 2020-04-03 DIAGNOSIS — I493 Ventricular premature depolarization: Secondary | ICD-10-CM | POA: Diagnosis not present

## 2020-04-03 LAB — ECHOCARDIOGRAM COMPLETE
AR max vel: 3.82 cm2
AV Area VTI: 4.02 cm2
AV Area mean vel: 3.8 cm2
AV Mean grad: 4 mmHg
AV Peak grad: 7.3 mmHg
Ao pk vel: 1.35 m/s
Area-P 1/2: 2.69 cm2
Calc EF: 54.9 %
S' Lateral: 3.9 cm
Single Plane A2C EF: 52.3 %
Single Plane A4C EF: 58 %

## 2020-04-03 NOTE — Telephone Encounter (Signed)
Echocardiogram reviewed, normal LV function, no significant cardiac pathology noted Certainly possible short runs of arrhythmia were brought on by COVID in recovery Best he may be to review monitor results once they are finalized in clinic, will likely take a week or 2 for final results to become available Okay to follow-up with Hannahmarie Asberry or APP

## 2020-04-04 ENCOUNTER — Telehealth: Payer: Self-pay | Admitting: Cardiovascular Disease

## 2020-04-04 NOTE — Telephone Encounter (Signed)
Attempted to schedule.  

## 2020-04-04 NOTE — Telephone Encounter (Signed)
  Patient Consent for Virtual Visit         Kyle Kim has provided verbal consent on 04/04/2020 for a virtual visit (video or telephone).   CONSENT FOR VIRTUAL VISIT FOR:  Kyle Kim  By participating in this virtual visit I agree to the following:  I hereby voluntarily request, consent and authorize [facility] and its employed or contracted physicians, physician assistants, nurse practitioners or other licensed health care professionals (the Practitioner), to provide me with telemedicine health care services (the "Services") as deemed necessary by the treating Practitioner. I acknowledge and consent to receive the Services by the Practitioner via telemedicine. I understand that the telemedicine visit will involve communicating with the Practitioner through live audiovisual communication technology and the disclosure of certain medical information by electronic transmission. I acknowledge that I have been given the opportunity to request an in-person assessment or other available alternative prior to the telemedicine visit and am voluntarily participating in the telemedicine visit.  I understand that I have the right to withhold or withdraw my consent to the use of telemedicine in the course of my care at any time, without affecting my right to future care or treatment, and that the Practitioner or I may terminate the telemedicine visit at any time. I understand that I have the right to inspect all information obtained and/or recorded in the course of the telemedicine visit and may receive copies of available information for a reasonable fee.  I understand that some of the potential risks of receiving the Services via telemedicine include:  Marland Kitchen Delay or interruption in medical evaluation due to technological equipment failure or disruption; . Information transmitted may not be sufficient (e.g. poor resolution of images) to allow for appropriate medical decision making by the Practitioner;  and/or  . In rare instances, security protocols could fail, causing a breach of personal health information.  Furthermore, I acknowledge that it is my responsibility to provide information about my medical history, conditions and care that is complete and accurate to the best of my ability. I acknowledge that Practitioner's advice, recommendations, and/or decision may be based on factors not within their control, such as incomplete or inaccurate data provided by me or distortions of diagnostic images or specimens that may result from electronic transmissions. I understand that the practice of medicine is not an exact science and that Practitioner makes no warranties or guarantees regarding treatment outcomes. I acknowledge that a copy of this consent can be made available to me via my patient portal ([facility]), or I can request a printed copy by calling the office of [facility].    I understand that my insurance will be billed for this visit.   I have read or had this consent read to me. . I understand the contents of this consent, which adequately explains the benefits and risks of the Services being provided via telemedicine.  . I have been provided ample opportunity to ask questions regarding this consent and the Services and have had my questions answered to my satisfaction. . I give my informed consent for the services to be provided through the use of telemedicine in my medical care

## 2020-04-04 NOTE — Telephone Encounter (Signed)
Patient needs virtual for this visit unless contraindicated by provider.    Scheduled with visser 4/11 at 8 am

## 2020-04-08 ENCOUNTER — Other Ambulatory Visit: Payer: Self-pay | Admitting: *Deleted

## 2020-04-08 MED ORDER — METOPROLOL TARTRATE 25 MG PO TABS: 25.0000 mg | ORAL_TABLET | Freq: Two times a day (BID) | ORAL | 3 refills | Status: DC

## 2020-04-15 ENCOUNTER — Telehealth: Payer: Self-pay

## 2020-04-15 NOTE — Telephone Encounter (Signed)
Able to reach pt regarding his recent Zio monitor results, Dr. Rockey Situ had a chance to review his results and advised   "Patient aware,  Frequent PVC and NSVT  Given prior smoking hx, probably need ischemic workup,  Would consider lexiscan myoview  Also would monitor BP and heart rate, as we might be able to increase the metoprolol dosing if thereis room"  Pt would like to discuss further in detail at his upcoming virtual visit with Vivia Ewing, PA-C on 4/21 and discuss for CTA vs lexiscan.  Will continue to monitor BP and HR, otherwise all questions or concerns were address and no additional concerns at this time. Agreeable to plan, will call back for anything further.

## 2020-04-22 ENCOUNTER — Telehealth: Payer: No Typology Code available for payment source | Admitting: Physician Assistant

## 2020-05-02 ENCOUNTER — Telehealth (INDEPENDENT_AMBULATORY_CARE_PROVIDER_SITE_OTHER): Payer: No Typology Code available for payment source | Admitting: Physician Assistant

## 2020-05-02 ENCOUNTER — Encounter: Payer: Self-pay | Admitting: Physician Assistant

## 2020-05-02 ENCOUNTER — Other Ambulatory Visit: Payer: Self-pay

## 2020-05-02 ENCOUNTER — Telehealth: Payer: Self-pay | Admitting: *Deleted

## 2020-05-02 VITALS — Ht 70.0 in | Wt 215.0 lb

## 2020-05-02 DIAGNOSIS — I493 Ventricular premature depolarization: Secondary | ICD-10-CM

## 2020-05-02 DIAGNOSIS — I498 Other specified cardiac arrhythmias: Secondary | ICD-10-CM

## 2020-05-02 DIAGNOSIS — I471 Supraventricular tachycardia: Secondary | ICD-10-CM

## 2020-05-02 DIAGNOSIS — I77810 Thoracic aortic ectasia: Secondary | ICD-10-CM | POA: Diagnosis not present

## 2020-05-02 DIAGNOSIS — I4729 Other ventricular tachycardia: Secondary | ICD-10-CM

## 2020-05-02 DIAGNOSIS — R002 Palpitations: Secondary | ICD-10-CM

## 2020-05-02 DIAGNOSIS — R9431 Abnormal electrocardiogram [ECG] [EKG]: Secondary | ICD-10-CM

## 2020-05-02 DIAGNOSIS — Z87891 Personal history of nicotine dependence: Secondary | ICD-10-CM | POA: Diagnosis not present

## 2020-05-02 DIAGNOSIS — Z8616 Personal history of COVID-19: Secondary | ICD-10-CM

## 2020-05-02 DIAGNOSIS — I472 Ventricular tachycardia: Secondary | ICD-10-CM | POA: Diagnosis not present

## 2020-05-02 DIAGNOSIS — Z8249 Family history of ischemic heart disease and other diseases of the circulatory system: Secondary | ICD-10-CM | POA: Diagnosis not present

## 2020-05-02 DIAGNOSIS — I38 Endocarditis, valve unspecified: Secondary | ICD-10-CM

## 2020-05-02 NOTE — Telephone Encounter (Signed)
Attempted to call pt to review instructions for Treadmill Myoview. Ordered at today's phone visit with Elenor Quinones, PA.  Mailed AVS. Will need to discuss procedure instructions with pt.  Pt did not answer. Lmtcb.

## 2020-05-02 NOTE — Progress Notes (Signed)
Virtual Visit via Telephone Note   This visit type was conducted due to national recommendations for restrictions regarding the COVID-19 Pandemic (e.g. social distancing) in an effort to limit this patient's exposure and mitigate transmission in our community.  Due to his co-morbid illnesses, this patient is at least at moderate risk for complications without adequate follow up.  This format is felt to be most appropriate for this patient at this time.  The patient did not have access to video technology/had technical difficulties with video requiring transitioning to audio format only (telephone).  All issues noted in this document were discussed and addressed.  No physical exam could be performed with this format.  Please refer to the patient's chart for his  consent to telehealth for [facility].    Date:  05/02/2020   ID:  Kyle Kim, DOB [birth year], MRN [id number] The patient was identified using 2 identifiers.  Patient Location: Home Provider Location: Office/Clinic   PCP:  [place] Pink, [place]  Cardiologist:  Ida Rogue, MD  Advanced Practice Provider:  No care team member to display Electrophysiologist:  None  Evaluation Performed:  Follow-Up Visit  Chief Complaint: Follow-up of recent live Zio AT cardiac monitoring results  History of Present Illness:    Kyle Kim is a 60 y.o. male with history of aortic root dilation (39 mm, 04/03/2020), NSVT, pSVT, ectopy at high burden including PVCs (24.2%, 86, plan and 38), ventricular couplets (8.4%, 14,917), recent COVID-19 infection, mild MR/TR, mild RVE, chronic back pain and degenerative disc disease, prior tobacco use with ongoing cessation, and established with [facility] cardiology for palpitations.    Family history includes an uncle on his mother side with coronary artery disease and history of MI, deceased at age 69 years.  Patient reports a long history of palpitations for many  years.  No associated chest pain or shortness of breath.  No dyspnea on exertion.  He has not previously had any cardiac work-up.  He reportedly works as a Engineer, maintenance.  He was seen 03/13/2020 with cardiac monitoring performed after frequent PVCs were noted on EKG, suggestive of a high burden of ectopy.  Beta-blocker was not added at that time.  Echo ordered to assess structure, given high PVC burden.  Coronary calcium scoring was discussed but deferred at that time, given his history of tobacco use.  It was recommended he talk with his PCP regarding ASA 325 mg daily.  BP 122/78 HR 74 bpm at the time of this office visit.  Subsequent lab work showed total cholesterol 199, LDL 118, LFTs normal, creatinine 0.81, TSH 2.99.  Zio monitoring was performed with on-call provider/PAC notified over the weekend 03/29/2020 due to meeting criteria for physician contact.  Zio monitoring showed high burden of nonsustained ventricular tachycardia at 126 episodes with a maximum of 8 beats in length but no sustained events.  Patient had 1 diary entry per representative, corresponding to ventricular bigeminy and triplets.  Average heart rate 78 bpm with lowest heart rate 50 bpm at 10:45 PM and maximum 193 bpm.  Case was discussed with Dr. Meda Coffee with recommendation to start metoprolol 25 mg twice daily.  Patient reported COVID while wearing the monitor, and as a result, he was laying low and not doing much.  He was not feeling very many of the palpitations.  The diary entry was when he was outside and felt his heart thumping briefly.  No dizziness, syncope, or chest pain reported.  He was otherwise feeling well.  Findings were discussed with patient and after this discussion, a 30-day prescription of metoprolol tartrate 25 mg twice daily was sent to requested pharmacy.  Message was subsequently routed to Dr. Rockey Situ to coordinate further actions.  Echo performed and showed normal LV SF without significant structural  abnormalities.  It was thought her primary cardiologist that short arrhythmias were possibly 2/2 COVID-19 recovery.  It was recommended he review the monitor results at his next patient visit in detail.   During today's visit, his echo was reviewed in detail.  We discussed that he had normal pulm function with RV mildly enlarged and mild MR, mild TR.  Aortic dilation was noted with recommendation to avoid fluoroquinolones and heavy lifting.  This information was provided on his AVS for future reference.  We discussed continued monitoring of the aortic dilation moving forward with patient understanding.  Today, 11/01/2020, he returns to clinic to review the cardiac monitoring results.  As above, monitoring showed frequent PVCs and NSVT.  Given his prior history of smoking, further ischemic work-up recommended.  MPI was recommended per primary cardiologist.  It was also recommended he monitor blood pressure and heart rate at home.  In the future, increase of metoprolol tartrate 25 mg twice daily was recommended if room and blood pressure and heart rate.  Please see below for copied and pasted report.  126 VT runs occurred, the fastest of which was 190 bpm.  The longest lasted 8 beats.  3 runs of SVT occurred with maximum rate 193 bpm.  Ventricular tachycardia was detected within approximately 45 seconds of symptomatic patient events.  Isolated ectopy was present.  Isolated PVCs were frequent at 24.2% or 86,338.  Ventricular couplets were frequent at 8.4% or 14,917.  We discussed that sometimes arrhythmia and ectopy can be indicative of underlying ischemia with patient understanding.  We reviewed his diet and lifestyle.  He currently reports that he has a regular routine of stretching with push-ups and jumping jacks each morning.  He reports that he can feel his heart pounding at times but usually this pounding resolves within 10 minutes of rest.  He drinks on average 4 to 5 cups of coffee.  He continues to  avoid tobacco since quitting smoking.  He reports 1-2 beers daily with on average 1 day skipped per week.  No report of lower extremity edema, abdominal distention, weight gain, early satiety, or any other signs or symptoms of volume overload.  He denies any chest pain or shortness of breath at rest.  No concerning symptoms with activity as described directly above.  We discussed monitoring his blood pressure at home with patient stating that he will go and purchase a blood pressure cuff.  Options for further ischemic work-up reviewed with patient preference for stress testing.  We reviewed both pharmacologic and treadmill stress testing -decision made for treadmill stress testing today and recommendation to flip to Advance if significant aortic murmur heard at the time of his stress test or unable to walk on the treadmill at that time given his recent COVID-19.    Past Medical History:  Diagnosis Date  . Arthritis    lower back  . Asthma   . Skin cancer 04/13/2016   left neck  . Wears contact lenses    Past Surgical History:  Procedure Laterality Date  . COLONOSCOPY    . ETHMOIDECTOMY Bilateral 11/10/2016   Procedure: ETHMOIDECTOMY;  Surgeon: Clyde Canterbury, MD;  Location: [place];  Service: ENT;  Laterality: Bilateral;  . FRONTAL SINUS EXPLORATION Bilateral 11/10/2016   Procedure: FRONTAL SINUS EXPLORATION;  Surgeon: Clyde Canterbury, MD;  Location: [place];  Service: ENT;  Laterality: Bilateral;  . IMAGE GUIDED SINUS SURGERY N/A 11/10/2016   Procedure: IMAGE GUIDED SINUS SURGERY;  Surgeon: Clyde Canterbury, MD;  Location: [place];  Service: ENT;  Laterality: N/A;  GAVE DISK TO [facility] 10-23  . MAXILLARY ANTROSTOMY Bilateral 11/10/2016   Procedure: MAXILLARY ANTROSTOMY WITH TISSUE REMOVAL;  Surgeon: Clyde Canterbury, MD;  Location: [place];  Service: ENT;  Laterality: Bilateral;  . SPHENOIDECTOMY Bilateral 11/10/2016   Procedure: SPHENOIDECTOMY;   Surgeon: Clyde Canterbury, MD;  Location: [place];  Service: ENT;  Laterality: Bilateral;     Current Meds  Medication Sig  . acetaminophen (TYLENOL) 325 MG tablet Take 650 mg by mouth every 6 (six) hours as needed.  Marland Kitchen albuterol (PROVENTIL HFA;VENTOLIN HFA) 108 (90 Base) MCG/ACT inhaler Inhale into the lungs every 6 (six) hours as needed for wheezing or shortness of breath.  Marland Kitchen aspirin 325 MG EC tablet Take 325 mg by mouth in the morning and at bedtime.  . cyclobenzaprine (FLEXERIL) 5 MG tablet Take 5 mg by mouth 3 (three) times daily as needed for muscle spasms.  . fluticasone (FLONASE) 50 MCG/ACT nasal spray INSTILL 2 SPRAYS IN EACH NOSTRIL TWICE DAILY  . montelukast (SINGULAIR) 10 MG tablet Take 10 mg by mouth daily.  . sildenafil (VIAGRA) 50 MG tablet TAKE 1 TABLET BY MOUTH ONCE DAILY AS NEEDED 30 MINUTES TO 1 HOUR PRIOR TO NEED     Allergies:   Pecan nut (diagnostic)   Social History   Tobacco Use  . Smoking status: Former Smoker    Types: Cigarettes    Quit date: 2008    Years since quitting: 14.3  . Smokeless tobacco: Never Used  Vaping Use  . Vaping Use: Never used  Substance Use Topics  . Alcohol use: Yes    Alcohol/week: 14.0 standard drinks    Types: 14 Cans of beer per week    Comment: 1-2 beers per days 5-6 days per week  . Drug use: Never     Family Hx: The patient's family history includes Diabetes in his mother.  ROS:   Please see the history of present illness.    Review of Systems  Respiratory: Negative for shortness of breath.   Cardiovascular: Positive for palpitations. Negative for chest pain, orthopnea, claudication, leg swelling and PND.  Musculoskeletal: Negative for falls.  Neurological: Negative for dizziness, loss of consciousness and weakness.  All other systems reviewed and are negative.   All other systems reviewed and are negative.   Prior CV studies:   The following studies were reviewed today:  Zio live cardiac  monitoring 04/01/2020 Holter Monitor Patch Wear Time:  3 days and 0 hours  Predominant underlying rhythm was Sinus Rhythm.  Patient had a min HR of 50 bpm, max HR of 193 bpm, and avg HR of 78 bpm.  126 Ventricular Tachycardia runs occurred, the run with the fastest interval lasting 4 beats with a max rate of 190 bpm, the longest lasting 8 beats with an avg rate of 159 bpm.  3 Supraventricular Tachycardia runs occurred, the run with the fastest interval lasting 4 beats with a max rate of 193 bpm, the longest lasting 8 beats with an avg rate of 128 bpm. Ventricular Tachycardia was  detected within +/- 45 seconds of symptomatic patient event(s).  Isolated SVEs were rare (<1.0%), SVE Couplets were rare (<1.0%), and SVE Triplets were rare (<1.0%).  Isolated VEs were frequent (24.2%, [id number]), VE Couplets were frequent (8.4%, 14917), and  VE Triplets were rare (<1.0%, 922). Ventricular Bigeminy and Trigeminy were present.  1 patient triggered event *This cardiac monitoring triggered Zio live to contact Dayna Dunn, PA-C, and metoprolol tartrate 25 mg twice daily prescribed at that time  Echo 04/03/20 1. Left ventricular ejection fraction, by estimation, is 55 to 60%. The  left ventricle has normal function. The left ventricle has no regional  wall motion abnormalities. Left ventricular diastolic parameters were  normal.  2. Right ventricular systolic function is normal. The right ventricular  size is mildly enlarged.  3. The mitral valve is normal in structure. Mild mitral valve  regurgitation.  Tricuspid Valve: The tricuspid valve is normal in structure. Tricuspid  valve regurgitation is mild.  4. The aortic valve was not well visualized. Aortic valve regurgitation  is not visualized.  5. Aortic dilatation noted. There is mild dilatation of the aortic root,  measuring 39 mm.  6. The inferior vena cava is normal in size with greater than 50%  respiratory variability, suggesting right atrial  pressure of 3 mmHg.    Labs/Other Tests and Data Reviewed:    EKG:  All of live Zio monitoring results/CV strips reviewed  Recent Labs: No results found for requested labs within last 8760 hours.   Recent Lipid Panel No results found for: CHOL, TRIG, HDL, CHOLHDL, LDLCALC, LDLDIRECT  Wt Readings from Last 3 Encounters:  05/02/20 215 lb (97.5 kg)  03/13/20 224 lb (101.6 kg)  11/10/16 207 lb (93.9 kg)     Risk Assessment/Calculations:      Objective:    Vital Signs:  Ht _0  (1.778 m)   Wt 215 lb (97.5 kg)   BMI 30.85 kg/m    VITAL SIGNS:  reviewed Unable to perform exam given telephone only visit  ASSESSMENT & PLAN:    Nonsustained ventricular tachycardia Paroxysmal supraventricular tachycardia PVCs/ventricular couplets --> HTN -- Recent live Zio performed for patient, after he reported long history of palpitations.  Cardiac monitoring reviewed in detail today with recommendation for further ischemic work-up.  We reviewed that arrhythmia and ectopy can often be associated with underlying ischemic disease with patient understanding.  Echo reviewed as above with normal pump function and mild valvular disease.  After review of options for noninvasive ischemic work-up, he is agreeable to treadmill nuclear study at this time.    Obtain nuclear treadmill stress test   Reommend cardiac examination prior to treadmill study by APP responsible for stress testing to confirm no significant aortic valvular disease, though on most recent echo as above, no aortic valvular disease noted.    Discussed possible exacerbating and alleviating factors for NSVT/PSVT/PACs/PVCs with patient understanding.  Continue to encourage exercise and activity.  Future labs to be collected at RTC include: TSH, FT4, CBC, and C-Met to ensure electrolytes, thyroid, and H&H at goal.  Further recommendations following nuclear stress testing.    If stress testing is not low risk, further evaluation with  cardiac catheterization recommended at that time.    If low risk stress test, consider EP referral at that time, given he does have significant ectopy and, per triggered events, suspect he is relatively asymptomatic with any sustained PSVT or NSVT.    Given his young age, EP discussion of options would be beneficial at this time.    Continue metoprolol 25  mg twice daily and reassess heart rate and blood pressure at RTC, at which time escalation of beta-blocker should be performed if room in heart rate and pressure.  I  t appears as if the metoprolol has dropped off of his medication list.  We will need to reach out to the patient and clarify the reason that this is the case.  We should let him know that he should always feel free to notify us if he chooses to stop taking the medication, at which time we can make further recommendations.    Albuterol use discouraged, except as a rescue inhaler, as it does not have a favorable side effect profile from a cardiovascular standpoint-->it is known to induce tachycardia/ectopy/elevated pressure and add extra stress to the heart.  Aortic root dilation -- Periodic echo +/- CT to monitor per ACC guidelines.  Recommend ASA 81 mg daily and statin, as well as heart rate, BP, and glycemic control, and from a risk factor modification standpoint.  AVS updated to provide information regarding avoiding fluoroquinolones, heavy lifting, and any vasovagal type maneuvers.  Further information regarding monitoring your blood pressure and fluid/salt restriction provided for risk factor modification moving forward.  He intends to buy a blood pressure cuff.  This was also reviewed with patient over the phone with his understanding.  Previous history of tobacco use -- Congratulated on ongoing smoking cessation.  Ongoing cessation encouraged.  Remote history of tobacco use, quitting over 10 years prior.  He did report when establishing with Dr. Rockey Situ that he smoked for some  time.  At that time, risk stratification recommended with CAC scoring.  If treadmill nuclear stress test as above is unrevealing, consider coronary CTA with FFR if indicated at that time.  Will defer for now.  Recommend ongoing lifestyle and risk factor modification.  Chronic low back pain - Agree with previous recommendation to avoid high-dose ASA 325 mg daily, as this will increase the likelihood of GI bleed.  He will talk with his PCP regarding other options.  Avoid NSAIDs if possible, as long-term use of nonsteroidal anti-inflammatories is associated with negative cardiovascular and renal effects, including elevated BP.  Medication management -- Of note, would avoid any long-acting nitrates or sublingual nitro given sildenafil on medication list.  Reassess ASA _0 mg and PCP decision.  We we will plan to discuss the patient's albuterol use and frequency of use at RTC, given albuterol does not have the most ideal side effect profile from a cardiovascular standpoint and can exacerbate any underlying rhythms or ectopy.  Ensure we revisit this at RTC.  Shared Decision Making/Informed Consent The risks [chest pain, shortness of breath, cardiac arrhythmias, dizziness, blood pressure fluctuations, myocardial infarction, stroke/transient ischemic attack, nausea, vomiting, allergic reaction, radiation exposure, metallic taste sensation and life-threatening complications (estimated to be 1 in 10,000)], benefits (risk stratification, diagnosing coronary artery disease, treatment guidance) and alternatives of a nuclear stress test were discussed in detail with Mr. Cashman and he agrees to proceed.    COVID-19 Education: The signs and symptoms of COVID-19 were discussed with the patient and how to seek care for testing (follow up with PCP or arrange E-visit).  The importance of social distancing was discussed today.  Time:   Today, I have spent 45 minutes with the patient with telehealth technology  discussing the above problems.     Medication Adjustments/Labs and Tests Ordered: Current medicines are reviewed at length with the patient today.  Concerns regarding medicines are outlined  above.   Tests Ordered: Orders Placed This Encounter  Procedures  . NM Myocar Multi W/Spect W/Wall Motion / EF    Medication Changes: No orders of the defined types were placed in this encounter.   Follow Up:  In Person After completion of treadmill nuclear stress test  Signed, Arvil Chaco, PA-C  05/02/2020 6:45 PM    [facility]

## 2020-05-02 NOTE — Patient Instructions (Addendum)
Medication Instructions:  Your physician recommends that you continue on your current medications as directed. Please refer to the Current Medication list given to you today.  *If you need a refill on your cardiac medications before your next appointment, please call your pharmacy*   Lab Work: None ordered  Testing/Procedures: [place]  Your caregiver has ordered a Treadmill Stress Test with nuclear imaging. The purpose of this test is to evaluate the blood supply to your heart muscle. This procedure is referred to as a "Non-Invasive Stress Test." This is because other than having an IV started in your vein, nothing is inserted or "invades" your body. Cardiac stress tests are done to find areas of poor blood flow to the heart by determining the extent of coronary artery disease (CAD). Some patients exercise on a treadmill, which naturally increases the blood flow to your heart, while others who are  unable to walk on a treadmill due to physical limitations have a pharmacologic/chemical stress agent called Lexiscan . This medicine will mimic walking on a treadmill by temporarily increasing your coronary blood flow.   Please note: these test may take anywhere between 2-4 hours to complete  PLEASE REPORT TO [place] AT THE FIRST DESK WILL DIRECT YOU WHERE TO GO  Date of Procedure:_____________________________________  Arrival Time for Procedure:______________________________  Instructions regarding medication:   ____ : Hold diabetes medication morning of procedure  ____:  Hold betablocker(s) night before procedure and morning of procedure    PLEASE NOTIFY THE OFFICE AT LEAST 24 HOURS IN ADVANCE IF YOU ARE UNABLE TO KEEP YOUR APPOINTMENT.  [phone] AND  PLEASE NOTIFY NUCLEAR MEDICINE AT [facility] AT LEAST 24 HOURS IN ADVANCE IF YOU ARE UNABLE TO KEEP YOUR APPOINTMENT. [phone]  How to prepare for your Myoview test:  1. Do not eat or drink  after midnight 2. No caffeine for 24 hours prior to test 3. No smoking 24 hours prior to test. 4. Your medication may be taken with water.  If your doctor stopped a medication because of this test, do not take that medication. 5. Ladies, please do not wear dresses.  Skirts or pants are appropriate. Please wear a short sleeve shirt. 6. No perfume, cologne or lotion. 7. Wear comfortable walking shoes. No heels!   COVID PRE- TEST: You will need a COVID TEST prior to the procedure:   LOCATION: Pre-Admit testing office, [address] in the [place]                                   located on the [facility] campus                                   at [address], [place], [place] [address]   DATE/TIME:  ____________ (anytime between 8 am and 2 pm)     Follow-Up: At [facility], you and your health needs are our priority.  As part of our continuing mission to provide you with exceptional heart care, we have created designated Provider Care Teams.  These Care Teams include your primary Cardiologist (physician) and Advanced Practice Providers (APPs -  Physician Assistants and Nurse Practitioners) who all work together to provide you with the care you need, when you need it.  We recommend signing up for the patient  portal called "MyChart".  Sign up information is provided on this After Visit Summary.  MyChart is used to connect with patients for Virtual Visits (Telemedicine).  Patients are able to view lab/test results, encounter notes, upcoming appointments, etc.  Non-urgent messages can be sent to your provider as well.   To learn more about what you can do with MyChart, go to [email or web address].    Your next appointment:    Follow up after myoview  The format for your next appointment:   In Person  Provider:   You may see Dr. Rockey Situ or one of the following Advanced Practice Providers on your designated Care Team:    Murray Hodgkins,  NP  Christell Faith, PA-C  Marrianne Mood, PA-C  Cadence Kathlen Mody, [place]  Laurann [place], NP    Other Instructions  Blood pressure: Please monitor your blood pressure at home with an upper arm cuff --We recommend upper arm BP cuffs over that of the wrist. --You can always check your device's accuracy against an office model once a year if possible. You can do so by bringing your cuff into the office and notifying the person that rooms you that you would like to check your cuff against our office readings. --Measure your BP at the same time each day. Blood pressure varies often throughout the day with higher readings in the morning. BP may also be lower at home than in the office. Do not measure BP right after you wake up or after exercising. Avoid caffeine, tobacco, and alcohol for 30 minutes before taking a measurement.  --Sit quietly for five minutes in a comfortable position with your legs and ankles uncrossed and back supported. Feet flat on the ground. Have your arm supported and at the level of your heart. Always use the same arm when taking your blood pressure. Place the cuff over bare skin rather than clothing. Each time you measure, take an additional reading if abnormal to ensure accurate by waiting 1-3 minutes after the first reading. --Please bring a BP log into the office. It is helpful to document the time of each BP reading, as well as any activity or medications taken around the reading. In addition, it is helpful to include heart rate at the time of the BP reading. Daily weights are also encouraged. --Goal BP is 130/80 or lower. If your BP is low but you are not dizzy, this is fine and preferred over BP higher than 130/80. If your BP is less than 100 for the top number and you are dizzy, call the office. If your blood pressure is consistently elevated with top number above 180 and bottom above 120, this can damage the body. If you have severe increase in your blood pressure or concerning  symptoms of severe chest pain, headache with confusion and blurred vision, severe abdominal or back pain, shortness of breath, seizures, or loss of consciousness, go to the emergency department.    Salt and blood pressure: We recommend a maximum of 2g sodium per day and 2L total fluid per day. Fluids include coffee, tea, water, and juice.  In addition, we recommend you monitor both your daily weight and daily BP at the same time each day - bring this long into the office.    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 The word "aneurysm" refers to a bulge in an artery (blood vessel). Most people think of them in the  context of an emergency, but yours was found incidentally. At this point there is nothing you need to do from a procedure standpoint, but there are some important things to keep in mind for day-to-day life.  Mainstays of therapy for aneurysms include very good blood pressure control, healthy lifestyle, and avoiding tobacco products and street drugs. Research has raised concern that antibiotics in the fluoroquinolone class could be associated with increased risk of having an aneurysm develop or tear. This includes medicines that end in "floxacin," like Cipro or Levaquin. Make sure to discuss this information with other healthcare providers if you require antibiotics.  Since aneurysms can run in families, you should discuss your diagnosis with first degree relatives as they may need to be screened for this. Regular mild-moderate physical exercise is important, but avoid heavy lifting/weight lifting over 30lbs, chopping wood, shoveling snow or digging heavy earth with a shovel. It is best to avoid activities that cause grunting or straining (medically referred to as a "Valsalva maneuver"). This happens when a person bears down against a closed throat to increase the strength of arm or abdominal muscles. There's  often a tendency to do this when lifting heavy weights, doing sit-ups, push-ups or chin-ups, etc., but it may be harmful.  This is a finding I would expect to be monitored periodically by your cardiology team. Most unruptured thoracic aortic aneurysms cause no symptoms, so they are often found during exams for other conditions. Contact a health care provider if you develop any discomfort in your upper back, neck, abdomen, trouble swallowing, cough or hoarseness, or unexplained weight loss. Get help right away if you develop severe pain in your upper back or abdomen that may move into your chest and arms, or any other concerning symptoms such as shortness of breath or fever.

## 2020-05-03 ENCOUNTER — Telehealth: Payer: Self-pay | Admitting: Physician Assistant

## 2020-05-03 NOTE — Telephone Encounter (Signed)
Attempted to call pt, lmtcb on cell. Home number attempted with no vm set up.  Second attempt to reach pt. Will need to review Myoview instructions and pt needs Myoview and f/u appt scheduled.   Note Covid test is not needed prior to Myoview. Listed on AVS and will make sure pt aware this is no longer required.

## 2020-05-03 NOTE — Telephone Encounter (Signed)
Left message to schedule stress test and follow up appointments

## 2020-05-06 NOTE — Telephone Encounter (Signed)
Called and spoke to pt. Reviewed instructions for Treadmill Myoview that was ordered at telephone visit last week.  Transferred to scheduling to set up date/time with pt.  Myoview has been scheduled for 05/22/20.  Pt verbalized understanding of instructions below, advised to call w/ any questions prior to procedure:    Kyle Kim  Your caregiver has ordered a Stress Test with nuclear imaging. The purpose of this test is to evaluate the blood supply to your heart muscle. This procedure is referred to as a "Non-Invasive Stress Test." This is because other than having an IV started in your vein, nothing is inserted or "invades" your body. Cardiac stress tests are done to find areas of poor blood flow to the heart by determining the extent of coronary artery disease (CAD). Some patients exercise on a treadmill, which naturally increases the blood flow to your heart, while others who are  unable to walk on a treadmill due to physical limitations have a pharmacologic/chemical stress agent called Lexiscan . This medicine will mimic walking on a treadmill by temporarily increasing your coronary blood flow.   Please note: these test may take anywhere between 2-4 hours to complete  PLEASE REPORT TO [place] AT THE FIRST DESK WILL DIRECT YOU WHERE TO GO  Date of Procedure:_____________________________________  Arrival Time for Procedure:______________________________  Instructions regarding medication:   PLEASE NOTIFY THE OFFICE AT LEAST 24 HOURS IN ADVANCE IF YOU ARE UNABLE TO KEEP YOUR APPOINTMENT.  [phone] AND  PLEASE NOTIFY NUCLEAR MEDICINE AT [facility] AT LEAST 24 HOURS IN ADVANCE IF YOU ARE UNABLE TO KEEP YOUR APPOINTMENT. [phone]  How to prepare for your Myoview test:  1. Do not eat or drink after midnight 2. No caffeine for 24 hours prior to test 3. No smoking 24 hours prior to test. 4. Your medication may be taken with water.  If your doctor  stopped a medication because of this test, do not take that medication. 5. Please wear a short sleeve shirt. 6. No perfume, cologne or lotion. 7. Wear comfortable walking shoes.

## 2020-05-22 ENCOUNTER — Ambulatory Visit: Payer: No Typology Code available for payment source

## 2020-05-29 ENCOUNTER — Telehealth: Payer: Self-pay | Admitting: Physician Assistant

## 2020-05-29 ENCOUNTER — Other Ambulatory Visit: Payer: Self-pay | Admitting: Physician Assistant

## 2020-05-29 ENCOUNTER — Other Ambulatory Visit: Payer: Self-pay | Admitting: Cardiovascular Disease

## 2020-05-29 ENCOUNTER — Encounter: Payer: Self-pay | Admitting: *Deleted

## 2020-05-29 ENCOUNTER — Ambulatory Visit
Admission: RE | Admit: 2020-05-29 | Discharge: 2020-05-29 | Disposition: A | Discharge status: Home / Self Care | Payer: No Typology Code available for payment source | Source: Ambulatory Visit | Attending: Physician Assistant | Admitting: Physician Assistant

## 2020-05-29 ENCOUNTER — Ambulatory Visit (HOSPITAL_COMMUNITY)
Admission: RE | Admit: 2020-05-29 | Discharge: 2020-05-29 | Disposition: A | Discharge status: Home / Self Care | Payer: No Typology Code available for payment source | Source: Ambulatory Visit | Attending: Physician Assistant | Admitting: Physician Assistant

## 2020-05-29 ENCOUNTER — Encounter: Payer: Self-pay | Admitting: Physician Assistant

## 2020-05-29 ENCOUNTER — Other Ambulatory Visit: Payer: Self-pay

## 2020-05-29 ENCOUNTER — Other Ambulatory Visit: Payer: Self-pay | Admitting: Cardiology

## 2020-05-29 DIAGNOSIS — I472 Ventricular tachycardia, unspecified: Secondary | ICD-10-CM

## 2020-05-29 DIAGNOSIS — I471 Supraventricular tachycardia: Secondary | ICD-10-CM

## 2020-05-29 DIAGNOSIS — I4729 Other ventricular tachycardia: Secondary | ICD-10-CM

## 2020-05-29 DIAGNOSIS — R9431 Abnormal electrocardiogram [ECG] [EKG]: Secondary | ICD-10-CM

## 2020-05-29 LAB — CBC
HCT: 47.6 % (ref 39.0–52.0)
Hemoglobin: 15.8 g/dL (ref 13.0–17.0)
MCH: 30.7 pg (ref 26.0–34.0)
MCHC: 33.2 g/dL (ref 30.0–36.0)
MCV: 92.4 fL (ref 80.0–100.0)
Platelets: 305 10*3/uL (ref 150–400)
RBC: 5.15 MIL/uL (ref 4.22–5.81)
RDW: 12.6 % (ref 11.5–15.5)
WBC: 5.6 10*3/uL (ref 4.0–10.5)
nRBC: 0 % (ref 0.0–0.2)

## 2020-05-29 LAB — NM MYOCAR SINGLE W/SPECT
Estimated workload: 4.9 METS
Exercise duration (min): 3 min
Exercise duration (sec): 17 s
Peak HR: 169 {beats}/min
Percent HR: 103 %
Rest HR: 65 {beats}/min
SRS: 6

## 2020-05-29 LAB — BASIC METABOLIC PANEL
Anion gap: 11 (ref 5–15)
BUN: 11 mg/dL (ref 6–20)
CO2: 22 mmol/L (ref 22–32)
Calcium: 9.2 mg/dL (ref 8.9–10.3)
Chloride: 105 mmol/L (ref 98–111)
Creatinine, Ser: 0.84 mg/dL (ref 0.61–1.24)
GFR, Estimated: >60 mL/min (ref >60)
Glucose, Bld: 104 mg/dL — ABNORMAL HIGH (ref 70–99)
Potassium: 4.1 mmol/L (ref 3.5–5.1)
Sodium: 138 mmol/L (ref 135–145)

## 2020-05-29 MED ORDER — METOPROLOL SUCCINATE ER 25 MG PO TB24: 25.0000 mg | ORAL_TABLET | Freq: Every day | ORAL | 2 refills | Status: DC

## 2020-05-29 MED ORDER — TECHNETIUM TC 99M TETROFOSMIN IV KIT
10.0000 | PACK | Freq: Once | INTRAVENOUS | Status: AC | PRN
  Administered 2020-05-29: 10.38 via INTRAVENOUS

## 2020-05-29 NOTE — Progress Notes (Signed)
Patient presented to the stress lab this morning for treadmill MPI as ordered by Marrianne Mood, PA-C. In short, he recently underwent Zio patch monitoring which demonstrated frequent PVCs at a 24.2% burden as well as ventricular couplets at 8.4% burden. Echo showed an EF of 55-60% with no RWMA. In this setting, he was scheduled for a treadmill stress test today. He was asymptomatic this morning upon starting the treadmill with an initial BP of 128/89 with EKG demonstrating NSR with a rare PVC. While in stage I, he began having occasional PVCs and ventricular couplets initially, followed by ventricular triplets, and short runs of NSVT lasting 4 beats. BP remained stable.  Prior to [place] being able to stop the test and get him off the treadmill, at 3:13 minutes into the test, he developed sustained VT, which persisted until 29 seconds into recovery. He did note some fatigue and SOB. His BP remained stable at 147/73. Supplemental oxygen was applied. Prior to administration of IV metoprolol, his arrhythmia spontaneously broke. Dr. Garen Lah was called upon the development of VT and promptly arrived in the stress lab to evaluate the patient. He remained hemodynamically stable. As he remained in recovery, his ventricular ectopy continued to improve. At test completion, he was back to baseline and asymptomatic.   Plan per MD: -Start Toprol XL 25 mg daily -Place Zio AT -[facility]  I have discussed scheduling his cath with our office, we will plan for this to occur 05/30/2020 with his primary cardiologist. Risks and benefits of cardiac catheterization have been discussed with the patient including risks of bleeding, bruising, infection, kidney damage, stroke, heart attack, and death. The patient understands these risks and is willing to proceed with the procedure. All questions have been answered and concerns listened to. He will be taken out of work until cath is performed. ED precautions discussed in detail. He will have  pre-cath labs drawn today in the medical mall.

## 2020-05-29 NOTE — Telephone Encounter (Signed)
Attempted to reach out to pt regarding his instructions for his heart cath tomorrow with Dr. Rockey Situ. Unable to reach pt via phone, advised to call back

## 2020-05-29 NOTE — Telephone Encounter (Signed)
Took incoming call from pt as he is calling back regarding his cath instructions. They are not upload to his MyChart account. Only shows he has a craterization schedule, not time or instructions associated with it.   Went over instructions with pt  1. Please arrive at the [place] at [facility] at 12:30 PM (This time is one hour before your procedure to ensure your preparation). Free valet parking service is available.   Special note: Every effort is made to have your procedure done on time. Please understand that emergencies sometimes delay scheduled procedures.  2. Diet: Do not eat solid foods after midnight.  You may have clear liquids until 5am upon the day of the procedure.  3. Labs: today- BMP/ CBC (completed at 13:14, results in chart))  4. Medication instructions in preparation for your procedure:   Contrast Allergy: No    On the morning of your procedure, take your Aspirin and any morning medicines NOT listed above.  You may use sips of water.  5. Plan for one night stay--bring personal belongings. 6. Bring a current list of your medications and current insurance cards. 7. You MUST have a responsible person to drive you home. 8. Someone MUST be with you the first 24 hours after you arrive home or your discharge will be delayed. 9. Please wear clothes that are easy to get on and off and wear slip-on shoes.  Answer all pt's concerns and questions, resubmitted instructions to his MyChart, pt confirmed he did received. Pt verbalized will try and find someone to be with him for the procedure, advised they may not start if no one is there to confirm he has a safe ride home. Pt verbalized understanding. Nothing further at this time.

## 2020-05-29 NOTE — Telephone Encounter (Signed)
Call received from Christell Faith, [place]. The patient presented for his nuclear stress test this morning at [facility]. He developed VT during the test. Per Thurmond Butts, he will need a left heart cath tomorrow with Dr. Rockey Situ.  Per scheduling, the case can be done at 1:30 pm.   A copy of the patient's instructions as stated below have been forwarded through his MyChart with some education for the heart cath.  I have [place], [place].  He message me back and asked if I could please call the patient with these instructions as well as he is now going into another test.   I advised I will call the patient.      [place], [place] [address] Dept: [phone] Loc: [place]  05/29/2020  You are scheduled for a Cardiac Catheterization on Thursday, May 19 with Dr. Ida Rogue.  1. Please arrive at the [place] at [facility] at 12:30 PM (This time is one hour before your procedure to ensure your preparation). Free valet parking service is available.   Special note: Every effort is made to have your procedure done on time. Please understand that emergencies sometimes delay scheduled procedures.  2. Diet: Do not eat solid foods after midnight.  You may have clear liquids until 5am upon the day of the procedure.  3. Labs: today- BMP/ CBC  4. Medication instructions in preparation for your procedure:   Contrast Allergy: No    On the morning of your procedure, take your Aspirin and any morning medicines NOT listed above.  You may use sips of water.  5. Plan for one night stay--bring personal belongings. 6. Bring a current list of your medications and current insurance cards. 7. You MUST have a responsible person to drive you home. 8. Someone MUST be with you the first 24 hours after you arrive home or your discharge will be delayed. 9. Please wear clothes that are  easy to get on and off and wear slip-on shoes.  Thank you for allowing [place] to care for you!   -- [facility]

## 2020-05-29 NOTE — Telephone Encounter (Signed)
Attempted to call the patient. No answer- I left a message to please call back.  

## 2020-05-29 NOTE — Telephone Encounter (Signed)
Patient returning call to discuss instructions and arrival time.

## 2020-05-29 NOTE — Progress Notes (Signed)
Orders for cath have been placed.  

## 2020-05-29 NOTE — Progress Notes (Signed)
Orders for Zio and pre-cath labs

## 2020-05-29 NOTE — H&P (View-Only) (Signed)
Orders for cath have been placed.  

## 2020-05-30 ENCOUNTER — Other Ambulatory Visit: Payer: Self-pay

## 2020-05-30 ENCOUNTER — Encounter: Payer: Self-pay | Admitting: Cardiovascular Disease

## 2020-05-30 ENCOUNTER — Encounter
Admission: RE | Disposition: A | Discharge status: Home / Self Care | Payer: Self-pay | Source: Home / Self Care | Attending: Cardiovascular Disease

## 2020-05-30 ENCOUNTER — Ambulatory Visit
Admission: RE | Admit: 2020-05-30 | Discharge: 2020-05-30 | Disposition: A | Discharge status: Home / Self Care | Payer: No Typology Code available for payment source | Attending: Cardiovascular Disease | Admitting: Cardiovascular Disease

## 2020-05-30 DIAGNOSIS — Z8249 Family history of ischemic heart disease and other diseases of the circulatory system: Secondary | ICD-10-CM | POA: Diagnosis not present

## 2020-05-30 DIAGNOSIS — R55 Syncope and collapse: Secondary | ICD-10-CM

## 2020-05-30 DIAGNOSIS — Z87891 Personal history of nicotine dependence: Secondary | ICD-10-CM | POA: Diagnosis not present

## 2020-05-30 DIAGNOSIS — G8929 Other chronic pain: Secondary | ICD-10-CM | POA: Insufficient documentation

## 2020-05-30 DIAGNOSIS — M5440 Lumbago with sciatica, unspecified side: Secondary | ICD-10-CM | POA: Insufficient documentation

## 2020-05-30 DIAGNOSIS — Z7982 Long term (current) use of aspirin: Secondary | ICD-10-CM | POA: Insufficient documentation

## 2020-05-30 DIAGNOSIS — I208 Other forms of angina pectoris: Secondary | ICD-10-CM | POA: Diagnosis not present

## 2020-05-30 DIAGNOSIS — I472 Ventricular tachycardia, unspecified: Secondary | ICD-10-CM

## 2020-05-30 DIAGNOSIS — I209 Angina pectoris, unspecified: Secondary | ICD-10-CM

## 2020-05-30 HISTORY — PX: LEFT HEART CATH AND CORONARY ANGIOGRAPHY: CATH118249

## 2020-05-30 SURGERY — LEFT HEART CATH AND CORONARY ANGIOGRAPHY
Anesthesia: Moderate Sedation

## 2020-05-30 MED ORDER — LABETALOL HCL 5 MG/ML IV SOLN: 10.0000 mg | INTRAVENOUS | Status: DC | PRN

## 2020-05-30 MED ORDER — SODIUM CHLORIDE 0.9% FLUSH: 3.0000 mL | Freq: Two times a day (BID) | INTRAVENOUS | Status: DC

## 2020-05-30 MED ORDER — MIDAZOLAM HCL 2 MG/2ML IJ SOLN
INTRAMUSCULAR | Status: DC | PRN
  Administered 2020-05-30 (×2): 1 mg via INTRAVENOUS

## 2020-05-30 MED ORDER — ASPIRIN 81 MG PO CHEW: 81.0000 mg | CHEWABLE_TABLET | ORAL | Status: DC

## 2020-05-30 MED ORDER — FENTANYL CITRATE (PF) 100 MCG/2ML IJ SOLN
INTRAMUSCULAR | Status: AC
  Filled 2020-05-30: qty 2

## 2020-05-30 MED ORDER — LIDOCAINE HCL (PF) 1 % IJ SOLN
INTRAMUSCULAR | Status: AC
  Filled 2020-05-30: qty 30

## 2020-05-30 MED ORDER — SODIUM CHLORIDE 0.9 % IV SOLN: 250.0000 mL | INTRAVENOUS | Status: DC | PRN

## 2020-05-30 MED ORDER — IOHEXOL 300 MG/ML  SOLN
INTRAMUSCULAR | Status: DC | PRN
  Administered 2020-05-30: 32 mL

## 2020-05-30 MED ORDER — ASPIRIN 81 MG PO CHEW
CHEWABLE_TABLET | ORAL | Status: AC
  Filled 2020-05-30: qty 1

## 2020-05-30 MED ORDER — HEPARIN (PORCINE) IN NACL 1000-0.9 UT/500ML-% IV SOLN
INTRAVENOUS | Status: DC | PRN
  Administered 2020-05-30: 1000 mL

## 2020-05-30 MED ORDER — SODIUM CHLORIDE 0.9 % WEIGHT BASED INFUSION: 1.0000 mL/kg/h | INTRAVENOUS | Status: DC

## 2020-05-30 MED ORDER — ACETAMINOPHEN 325 MG PO TABS: 650.0000 mg | ORAL_TABLET | ORAL | Status: DC | PRN

## 2020-05-30 MED ORDER — HEPARIN (PORCINE) IN NACL 1000-0.9 UT/500ML-% IV SOLN
INTRAVENOUS | Status: AC
  Filled 2020-05-30: qty 1000

## 2020-05-30 MED ORDER — SODIUM CHLORIDE 0.9 % WEIGHT BASED INFUSION
3.0000 mL/kg/h | INTRAVENOUS | Status: AC
  Administered 2020-05-30: 3 mL/kg/h via INTRAVENOUS

## 2020-05-30 MED ORDER — LIDOCAINE HCL (PF) 1 % IJ SOLN
INTRAMUSCULAR | Status: DC | PRN
  Administered 2020-05-30: 25 mL

## 2020-05-30 MED ORDER — SODIUM CHLORIDE 0.9% FLUSH: 3.0000 mL | INTRAVENOUS | Status: DC | PRN

## 2020-05-30 MED ORDER — FENTANYL CITRATE (PF) 100 MCG/2ML IJ SOLN
INTRAMUSCULAR | Status: DC | PRN
  Administered 2020-05-30 (×2): 25 ug via INTRAVENOUS

## 2020-05-30 MED ORDER — HYDRALAZINE HCL 20 MG/ML IJ SOLN: 10.0000 mg | INTRAMUSCULAR | Status: DC | PRN

## 2020-05-30 MED ORDER — ONDANSETRON HCL 4 MG/2ML IJ SOLN: 4.0000 mg | Freq: Four times a day (QID) | INTRAMUSCULAR | Status: DC | PRN

## 2020-05-30 MED ORDER — MIDAZOLAM HCL 2 MG/2ML IJ SOLN
INTRAMUSCULAR | Status: AC
  Filled 2020-05-30: qty 2

## 2020-05-30 SURGICAL SUPPLY — 12 items
CATH INFINITI 5FR ANG PIGTAIL (CATHETERS) ×2 IMPLANT
CATH INFINITI 5FR JL4 (CATHETERS) ×2 IMPLANT
CATH INFINITI JR4 5F (CATHETERS) ×2 IMPLANT
COVER EZ STRL 42X30 (DRAPES) ×2 IMPLANT
DEVICE CLOSURE MYNXGRIP 5F (Vascular Products) ×2 IMPLANT
NEEDLE PERC 18GX7CM (NEEDLE) ×2 IMPLANT
PACK CARDIAC CATH (CUSTOM PROCEDURE TRAY) ×2 IMPLANT
PROTECTION STATION PRESSURIZED (MISCELLANEOUS) ×2
SET ATX SIMPLICITY (MISCELLANEOUS) ×2 IMPLANT
SHEATH AVANTI 5FR X 11CM (SHEATH) ×2 IMPLANT
STATION PROTECTION PRESSURIZED (MISCELLANEOUS) ×1 IMPLANT
WIRE GUIDERIGHT .035X150 (WIRE) ×2 IMPLANT

## 2020-05-30 NOTE — Discharge Instructions (Signed)
Angiogram, Care After This sheet gives you information about how to care for yourself after your procedure. Your health care provider may also give you more specific instructions. If you have problems or questions, contact your health care provider. What can I expect after the procedure? After the procedure, it is common to have:  Bruising and tenderness at the catheter insertion area.  A collection of blood (hematoma) at the insertion area. This may feel like a small lump under the skin at the insertion site. Follow these instructions at home: Insertion site care  Follow instructions from your health care provider about how to take care of your insertion site. Make sure you: ? Wash your hands with soap and water before and after you change your bandage (dressing). If soap and water are not available, use hand sanitizer. ? Change your dressing as told by your health care provider.  Do not take baths, swim, or use a hot tub until your health care provider approves.  You may shower 24-48 hours after the procedure, or as told by your health care provider. To clean the insertion site: ? Gently wash the area with plain soap and water. ? Pat the area dry with a clean towel. ? Do not rub the site. This may cause bleeding.  Check your insertion site every day for signs of infection. Check for: ? Redness, swelling, or pain. ? Fluid or blood. ? Warmth. ? Pus or a bad smell.  Do not apply powder or lotion to the site. Keep the site clean and dry.   Activity  Do not drive for 24 hours if you were given a sedative during your procedure.  Rest as told by your health care provider, usually for 1-2 days.  Do not lift anything that is heavier than 10 lb (4.5 kg), or the limit that you are told, until your health care provider says that it is safe.  If the insertion site was in your leg, try to avoid stairs for a few days.  Return to your normal activities as told by your health care provider,  usually in about a week. Ask your health care provider what activities are safe for you. General instructions  If your insertion site starts bleeding, lie flat and put pressure on the site. If the bleeding does not stop, get help right away. This is a medical emergency.  Take over-the-counter and prescription medicines only as told by your health care provider.  Drink enough fluid to keep your urine pale yellow. This helps flush the contrast dye from your body.  Keep all follow-up visits as told by your health care provider. This is important.   Contact a health care provider if:  You have a fever or chills.  You have redness, swelling, or pain around your insertion site.  You have fluid or blood coming from your insertion site.  Your insertion site feels warm to the touch.  You have pus or a bad smell coming from your insertion site.  You have more bruising around the insertion site. Get help right away if you have:  A problem with the insertion area, such as: ? The area swells fast or bleeds even after you apply pressure. ? The area becomes pale, cool, tingly, or numb.  Chest pain.  Trouble breathing.  A rash.  Any symptoms of a stroke. "BE FAST" is an easy way to remember the main warning signs of a stroke: ? B - Balance. Signs are dizziness, sudden trouble walking,   or loss of balance. ? E - Eyes. Signs are trouble seeing or a sudden change in vision. ? F - Face. Signs are sudden weakness or loss of feeling of the face, or the face or eyelid drooping on one side. ? A - Arms. Signs are weakness or loss of feeling in an arm. This happens suddenly and usually on one side of the body. ? S - Speech. Signs are sudden trouble speaking, slurred speech, or trouble understanding what people say. ? T - Time. Time to call emergency services. Write down what time symptoms started.  You have other signs of a stroke, such as: ? A sudden, severe headache with no known cause. ? Nausea  or vomiting. ? Seizure. These symptoms may represent a serious problem that is an emergency. Do not wait to see if the symptoms will go away. Get medical help right away. Call your local emergency services (911 in the [place] Do not drive yourself to the hospital. Summary  It is common to have bruising and tenderness at the catheter insertion area.  Do not take baths, swim, or use a hot tub until your health care provider approves. You may shower 24-48 hours after the procedure or as told.  It is important to rest and drink plenty of fluids.  If the insertion site bleeds, lie flat and put pressure on the site. If the bleeding continues, get help right away. This is a medical emergency. This information is not intended to replace advice given to you by your health care provider. Make sure you discuss any questions you have with your health care provider. Document Revised: 11/02/2018 Document Reviewed: 11/02/2018 Elsevier Patient Education  2021 [employer]vier Inc.  

## 2020-05-31 ENCOUNTER — Telehealth: Payer: Self-pay | Admitting: *Deleted

## 2020-05-31 ENCOUNTER — Encounter: Payer: Self-pay | Admitting: Cardiovascular Disease

## 2020-05-31 NOTE — Telephone Encounter (Signed)
-----   Message from Minna Merritts, MD sent at 05/30/2020  3:36 PM EDT ----- Needs f/u with electrophysiology Cardiac catheterization no significant disease Still wearing his Zio Thx TG

## 2020-05-31 NOTE — Telephone Encounter (Signed)
Reviewed the patient's chart. He is currently scheduled to see Dr. Quentin Ore on 06/19/20, but this should probably be pushed out at least another 1-2 weeks to allow for the monitor to result completely.  To scheduling.  He is wearing a ZIO AT, so we should be notified of any alerts while he is wearing this and can bring him in sooner if needed.

## 2020-05-31 NOTE — Telephone Encounter (Signed)
Will forward to scheduling to please reach out to the patient with an appointment for Dr. Caryl Comes. He is supposed to wear his monitor from 05/29/20-06/12/20.  He should see Dr. Caryl Comes after the monitor is completed, so in roughly 5-6 weeks as a Consult.

## 2020-06-02 NOTE — Telephone Encounter (Signed)
Yes we can provide a work note to take you out for those 5 days thank you We can put this in my chart and send it to him

## 2020-06-03 NOTE — Progress Notes (Signed)
Dr. Rockey Situ advised work note for 5 days, following pt's heart cath on 05/30/2020

## 2020-06-07 NOTE — Telephone Encounter (Signed)
Patients appointment has been rescheduled.

## 2020-06-10 ENCOUNTER — Telehealth: Payer: Self-pay | Admitting: Physician Assistant

## 2020-06-10 NOTE — Telephone Encounter (Signed)
   Irhythm event monitor called the answering service after-hours today. Chart reviewed.   Pt was seen in the office in 03/2020 with palpitations and PVCs. 2D echo 03/2020 showed normal LV function. At that time we received end of service report in 03/2020 that patient had high burden of short NSVT (126 episodes, max 8 beats in length at that time), 24% PVCs - largely asymptomatic at the time. He had had Covid around the time this occurred. At that time Lopressor 25mg  BID was added per phone note. He was seen in followup by Marrianne Mood PA-C and Dr. Rockey Situ. At some point Lopressor had fallen off the list. He underwent treadmill stress test 05/29/2020 which was terminated due to development of sustained VT with activity. Patient developed SOB and fatigue with this. Treadmill was stopped, patient was helped to bed and he spontaneously converted to NSR without intervention. He was started on Toprol XL 25mg  daily. He underwent cardiac cath 05/30/20 that showed normal coronaries. Last labs reviewed 05/29/20 K 4.1, 02/2020 TSH wnl, no prior mag on file. He was subsequently referred to EP with consult is pending on 07/03/20.   Today, IRhythm called to notify that patient had episode of ventricular tachycardia 184bpm, 15 seconds, today at 11:41am. This is not a completion report but they were reviewing whether this was consistent with prior activity and patient was noted to many episode of VT at other times - longest episode 1 min 6 sec on 5/24 at 10:50pm (iRhythm looking into why this was not a call report). Average HR 90s thus far when in NSR. The patient does report he is symptomatic with these episodes to some degree - they happen with activity, he gets lightheaded and has to go inside and sit down for them to resolve. (He pushed the manual button with today's episode.) I spoke with Dr. Marlou Porch. Given the fact that he has now demonstrated sustained VT associated with symptoms, we would recommend he proceed to [facility] (and not to drive himself) for further evaluation to include labs (suggest BMET, Mg, TSH at minimum) and likely expedited EP consultation in AM to discuss next steps. He was very hesitant to come into the hospital and so I discussed the rationale. He states today is inconvenient and he would rather try to come in tomorrow. I discussed potential risk/unknowns of waiting and recommended he come in today. He states he will think about it and talk to his wife. Per d/w Dr. Marlou Porch will have patient increase metoprolol to 2 tablets (50mg ) daily for now - patient aware. He was instructed he should not drive until cleared by his cardiology team. Will route to primary cardiologist to make him aware, as well as Dr. Quentin Ore who is covering EP tomorrow and was scheduled to see patient in June.   Charlie Pitter, PA-C

## 2020-06-10 NOTE — H&P (Addendum)
H&P Addendum, precardiac catheterization  Patient was seen and evaluated prior to Cardiac catheterization procedure Symptoms, prior testing details again confirmed with the patient Patient examined, no significant change from prior exam Lab work reviewed in detail personally by myself Patient understands risk and benefit of the procedure, willing to proceed   Full H&P as below ID:  Kyle Kim, DOB September 01, 1960, MRN [id number]  PCP:  Kyle Pink, MD           Chief Complaint  Patient presents with  . New Patient (Initial Visit)    Referred by PCP for palpitations. Meds reviewed verbally with patient.     HPI:  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  Recent lipid panel not available "cholesterol a little high" per primary care  Chronic back pain, takes aspirin, NSAIDs daily  Rarely feels his palpitations Feels he has had them for many years Denies any chest pain or shortness of breath on exertion No prior cardiac work-up  On discussion of family history Uncle, mothers side: CAD, MI died in 61s  EKG personally reviewed by myself on todays visit Shows normal sinus rhythm rate 74 bpm PVCs in a trigeminal pattern  Records requested from primary care Lab work reviewed, total cholesterol 199, LDL 118, normal LFTs, creatinine 0.81 TSH 2.99  PMH:   has a past medical history of Arthritis, Asthma, Skin cancer (04/13/2016), and Wears contact lenses.  PSH:         Past Surgical History:  Procedure Laterality Date  . COLONOSCOPY    . ETHMOIDECTOMY Bilateral 11/10/2016   Procedure: ETHMOIDECTOMY;  Surgeon: Kyle Canterbury, MD;  Location: [place];  Service: ENT;  Laterality: Bilateral;   . FRONTAL SINUS EXPLORATION Bilateral 11/10/2016   Procedure: FRONTAL SINUS EXPLORATION;  Surgeon: Kyle Canterbury, MD;  Location: [place];  Service: ENT;  Laterality: Bilateral;  . IMAGE GUIDED SINUS SURGERY N/A 11/10/2016   Procedure: IMAGE GUIDED SINUS SURGERY;  Surgeon: Kyle Canterbury, MD;  Location: [place];  Service: ENT;  Laterality: N/A;  GAVE DISK TO CECE 10-23  . MAXILLARY ANTROSTOMY Bilateral 11/10/2016   Procedure: MAXILLARY ANTROSTOMY WITH TISSUE REMOVAL;  Surgeon: Kyle Canterbury, MD;  Location: [place];  Service: ENT;  Laterality: Bilateral;  . SPHENOIDECTOMY Bilateral 11/10/2016   Procedure: SPHENOIDECTOMY;  Surgeon: Kyle Canterbury, MD;  Location: [place];  Service: ENT;  Laterality: Bilateral;          Current Outpatient Medications  Medication Sig Dispense Refill  . acetaminophen (TYLENOL) 325 MG tablet Take 650 mg by mouth every 6 (six) hours as needed.    Kyle Kitchen albuterol (PROVENTIL HFA;VENTOLIN HFA) 108 (90 Base) MCG/ACT inhaler Inhale into the lungs every 6 (six) hours as needed for wheezing or shortness of breath.    Kyle Kitchen aspirin 325 MG EC tablet Take 325 mg by mouth in the morning and at bedtime.    . cyclobenzaprine (FLEXERIL) 5 MG tablet Take 5 mg by mouth 3 (three) times daily as needed for muscle spasms.    . fluticasone (FLONASE) 50 MCG/ACT nasal spray INSTILL 2 SPRAYS IN EACH NOSTRIL TWICE DAILY    . montelukast (SINGULAIR) 10 MG tablet Take 10 mg by mouth daily.    . sildenafil (VIAGRA) 50 MG  tablet TAKE 1 TABLET BY MOUTH ONCE DAILY AS NEEDED 30 MINUTES TO 1 HOUR PRIOR TO NEED     No current facility-administered medications for this visit.     Allergies:   Pecan nut (diagnostic)   Social History:  The patient  reports that he quit smoking about 14 years ago. His smoking use included cigarettes. He has never used smokeless tobacco. He reports current alcohol use of about 14.0 standard drinks of  alcohol per week. He reports that he does not use drugs.   Family History:   family history is not on file.    Review of Systems: Review of Systems  Constitutional: Negative.   HENT: Negative.   Respiratory: Negative.   Cardiovascular: Positive for palpitations.  Gastrointestinal: Negative.   Musculoskeletal: Negative.   Neurological: Negative.   Psychiatric/Behavioral: Negative.   All other systems reviewed and are negative.    PHYSICAL EXAM: VS:  BP 122/78 (BP Location: Right Arm, Patient Position: Sitting, Cuff Size: Normal)   Pulse 74   Ht 5\' 10"  (1.778 m)   Wt 224 lb (101.6 kg)   SpO2 97%   BMI 32.14 kg/m  , BMI Body mass index is 32.14 kg/m. GEN: Well nourished, well developed, in no acute distress HEENT: normal Neck: no JVD, carotid bruits, or masses Cardiac: RRR; no murmurs, rubs, or gallops,no edema  Respiratory:  clear to auscultation bilaterally, normal work of breathing GI: soft, nontender, nondistended, + BS MS: no deformity or atrophy Skin: warm and dry, no rash Neuro:  Strength and sensation are intact Psych: euthymic Kim, full affect   Recent Labs: No results found for requested labs within last 8760 hours.    Lipid Panel Recent Labs  No results found for: CHOL, HDL, LDLCALC, TRIG         Wt Readings from Last 3 Encounters:  03/13/20 224 lb (101.6 kg)  11/10/16 207 lb (93.9 kg)      ASSESSMENT AND PLAN:  Problem List Items Addressed This Visit   None         Visit Diagnoses    Frequent PVCs    -  Primary   Relevant Medications   aspirin 325 MG EC tablet   sildenafil (VIAGRA) 50 MG tablet   Other Relevant Orders   EKG 12-Lead   LONG TERM MONITOR (3-14 DAYS)   ECHOCARDIOGRAM COMPLETE   Palpitations       Relevant Orders   EKG 12-Lead   LONG TERM MONITOR (3-14 DAYS)   ECHOCARDIOGRAM COMPLETE   Former smoker       Chronic low back pain with sciatica, sciatica laterality unspecified, unspecified  back pain laterality       Relevant Medications   aspirin 325 MG EC tablet   cyclobenzaprine (FLEXERIL) 5 MG tablet      Patient presented to the stress lab 05/29/2020  for treadmill MPI as ordered by Kyle Mood, PA-C. In short, he recently underwent Zio patch monitoring which demonstrated frequent PVCs at a 24.2% burden as well as ventricular couplets at 8.4% burden. Echo showed an EF of 55-60% with no RWMA. In this setting, he was scheduled for a treadmill stress test today. He was asymptomatic this morning upon starting the treadmill with an initial BP of 128/89 with EKG demonstrating NSR with a rare PVC. While in stage I, he began having occasional PVCs and ventricular couplets initially, followed by ventricular triplets, and short runs of NSVT lasting 4 beats. BP remained stable.  Prior to [place]  being able to stop the test and get him off the treadmill, at 3:13 minutes into the test, he developed sustained VT, which persisted until 29 seconds into recovery. He did note some fatigue and SOB. His BP remained stable at 147/73. Supplemental oxygen was applied. Prior to administration of IV metoprolol, his arrhythmia spontaneously broke. Dr. Garen Lah was called upon the development of VT and promptly arrived in the stress lab to evaluate the patient. He remained hemodynamically stable. As he remained in recovery, his ventricular ectopy continued to improve. At test completion, he was back to baseline and asymptomatic.   Based on the above, scheduled for cardiac cath I have reviewed the risks, indications, and alternatives to cardiac catheterization, possible angioplasty, and stenting with the patient. Risks include but are not limited to bleeding, infection, vascular injury, stroke, myocardial infection, arrhythmia, kidney injury, radiation-related injury in the case of prolonged fluoroscopy use, emergency cardiac surgery, and death. The patient understands the risks of serious complication is  1-2 in 7125 with diagnostic cardiac cath and 1-2% or less with angioplasty/stenting.   Other medical issues Former smoker  Chronic low back pain   Signed, Esmond Plants, MD, Ph.D [facility]

## 2020-06-10 NOTE — Interval H&P Note (Signed)
History and Physical Interval Note:  06/10/2020 12:08 PM  Kyle Kim  has presented today for surgery, with the diagnosis of LT Heart Cath   Ventricular tachycardia, angina, near syncope.  The various methods of treatment have been discussed with the patient and family. After consideration of risks, benefits and other options for treatment, the patient has consented to  Procedure(s): LEFT HEART CATH AND CORONARY ANGIOGRAPHY (N/A) as a surgical intervention.  The patient's history has been reviewed, patient examined, no change in status, stable for surgery.  I have reviewed the patient's chart and labs.  Questions were answered to the patient's satisfaction.     Kyle Kim

## 2020-06-11 ENCOUNTER — Emergency Department (HOSPITAL_COMMUNITY): Payer: No Typology Code available for payment source

## 2020-06-11 ENCOUNTER — Emergency Department (HOSPITAL_COMMUNITY)
Admission: EM | Admit: 2020-06-11 | Discharge: 2020-06-11 | Disposition: A | Discharge status: Home / Self Care | Payer: No Typology Code available for payment source | Attending: Emergency Medicine | Admitting: Emergency Medicine

## 2020-06-11 ENCOUNTER — Other Ambulatory Visit: Payer: Self-pay

## 2020-06-11 ENCOUNTER — Encounter (HOSPITAL_COMMUNITY): Payer: Self-pay

## 2020-06-11 DIAGNOSIS — I472 Ventricular tachycardia, unspecified: Secondary | ICD-10-CM

## 2020-06-11 DIAGNOSIS — I493 Ventricular premature depolarization: Secondary | ICD-10-CM

## 2020-06-11 DIAGNOSIS — Z85828 Personal history of other malignant neoplasm of skin: Secondary | ICD-10-CM | POA: Insufficient documentation

## 2020-06-11 DIAGNOSIS — R002 Palpitations: Secondary | ICD-10-CM | POA: Diagnosis present

## 2020-06-11 DIAGNOSIS — Z87891 Personal history of nicotine dependence: Secondary | ICD-10-CM | POA: Diagnosis not present

## 2020-06-11 DIAGNOSIS — J45909 Unspecified asthma, uncomplicated: Secondary | ICD-10-CM | POA: Diagnosis not present

## 2020-06-11 DIAGNOSIS — Z20822 Contact with and (suspected) exposure to covid-19: Secondary | ICD-10-CM | POA: Insufficient documentation

## 2020-06-11 LAB — BASIC METABOLIC PANEL
Anion gap: 7 (ref 5–15)
BUN: 11 mg/dL (ref 6–20)
CO2: 24 mmol/L (ref 22–32)
Calcium: 8.8 mg/dL — ABNORMAL LOW (ref 8.9–10.3)
Chloride: 105 mmol/L (ref 98–111)
Creatinine, Ser: 0.9 mg/dL (ref 0.61–1.24)
GFR, Estimated: >60 mL/min (ref >60)
Glucose, Bld: 118 mg/dL — ABNORMAL HIGH (ref 70–99)
Potassium: 4.4 mmol/L (ref 3.5–5.1)
Sodium: 136 mmol/L (ref 135–145)

## 2020-06-11 LAB — CBC
HCT: 44.6 % (ref 39.0–52.0)
Hemoglobin: 14.8 g/dL (ref 13.0–17.0)
MCH: 31.2 pg (ref 26.0–34.0)
MCHC: 33.2 g/dL (ref 30.0–36.0)
MCV: 93.9 fL (ref 80.0–100.0)
Platelets: 283 10*3/uL (ref 150–400)
RBC: 4.75 MIL/uL (ref 4.22–5.81)
RDW: 12.6 % (ref 11.5–15.5)
WBC: 7 10*3/uL (ref 4.0–10.5)
nRBC: 0 % (ref 0.0–0.2)

## 2020-06-11 LAB — TROPONIN I (HIGH SENSITIVITY)
Troponin I (High Sensitivity): 7 ng/L (ref <18)
Troponin I (High Sensitivity): 6 ng/L (ref <18)

## 2020-06-11 LAB — RESP PANEL BY RT-PCR (FLU A&B, COVID) ARPGX2
Influenza A by PCR: NEGATIVE
Influenza B by PCR: NEGATIVE
SARS Coronavirus 2 by RT PCR: NEGATIVE

## 2020-06-11 LAB — MAGNESIUM: Magnesium: 2 mg/dL (ref 1.7–2.4)

## 2020-06-11 LAB — TSH: TSH: 2.945 u[IU]/mL (ref 0.350–4.500)

## 2020-06-11 MED ORDER — GADOBUTROL 1 MMOL/ML IV SOLN
10.0000 mL | Freq: Once | INTRAVENOUS | Status: AC | PRN
  Administered 2020-06-11: 10 mL via INTRAVENOUS

## 2020-06-11 MED ORDER — LORAZEPAM 2 MG/ML IJ SOLN: 1.0000 mg | INTRAMUSCULAR | Status: DC | PRN

## 2020-06-11 MED ORDER — METOPROLOL SUCCINATE ER 25 MG PO TB24
50.0000 mg | ORAL_TABLET | Freq: Two times a day (BID) | ORAL | Status: DC
  Filled 2020-06-11: qty 2

## 2020-06-11 MED ORDER — LORAZEPAM 1 MG PO TABS: 0.5000 mg | ORAL_TABLET | ORAL | Status: DC | PRN

## 2020-06-11 MED ORDER — LORAZEPAM 2 MG/ML IJ SOLN: 0.5000 mg | INTRAMUSCULAR | Status: DC | PRN

## 2020-06-11 MED ORDER — METOPROLOL SUCCINATE ER 100 MG PO TB24: 100.0000 mg | ORAL_TABLET | Freq: Every day | ORAL | 0 refills | Status: DC

## 2020-06-11 NOTE — Consult Note (Signed)
ELECTROPHYSIOLOGY CONSULT NOTE    Patient ID: Kyle Kim MRN: [id number], DOB/AGE: July 04, 1960 60 y.o.  Admit date: 06/11/2020 Date of Consult: 06/11/2020  Primary Physician: [place] Pink, MD Primary Cardiologist: Ida Rogue, MD  Electrophysiologist: New   Referring Provider: Dr. Gilford Raid (ED) -> Dr. Rockey Situ (outpatient)  Patient Profile: Kyle Kim is a 60 y.o. male with a history of palpitations and PVCs who is being seen today for the evaluation of ventricular tachycardia at the request of Dr. Gilford Raid and Dr. Rockey Situ.  HPI:  Kyle Kim is a 60 y.o. male with medical history as above and recent work up for palpitations.   Pt was seen in the office in 03/2020 with palpitations and PVCs. 2D echo 03/2020 showed normal LV function. At that time we received end of service report in 03/2020 that patient had high burden of short NSVT (126 episodes, max 8 beats in length at that time), 24% PVCs - largely asymptomatic at the time. He had had Covid around the time this occurred. At that time Lopressor 25mg  BID was added per phone note. He was seen in followup by Marrianne Mood PA-C and Dr. Rockey Situ. At some point Lopressor had fallen off the list. He underwent treadmill stress test 05/29/2020 which was terminated due to development of sustained VT with activity. Patient developed SOB and fatigue with this. Treadmill was stopped, patient was helped to bed and he spontaneously converted to NSR without intervention. He was started on Toprol XL 25mg  daily. He underwent cardiac cath 05/30/20 that showed normal coronaries. Last labs reviewed 05/29/20 K 4.1, 02/2020 TSH wnl, no prior mag on file. He was subsequently referred to EP with consult is pending on 07/03/20.   iRhythm called to notify 5/30 of episode VT up to 184 bpm for 15 seconds at 1141 am. Pt also noted to previously have had a VT episode of 1 min 6 seconds that was (NOT called) on 5/24 at 1050 bpm (?XT).   The patient was called and  reported lightheadedness and having to sit and wait for them to resolve. The episode on 5/30 was activated by the patient.  Pt was recommended to proceed to [facility] due to sustained VT with symptoms.   Pertinent labs on admission include Cr 0.9, K 4.4, Mg 2.0, Hgb 14.8, HS-Trop 7 CXR clear.  This morning, patient feels OK. He reports hard palpitations, mild SOB, and lightheadedness when he has NSVT. He had a maternal uncle that passed away at 79 "in his sleep". He denies any other family history of sudden cardiac death. He denies chest pain with or without exertion, syncope, edema, n/v/d, or recent illness. He does feel like it has been worse since he got his COVID booster last year. He took 51 of his toprol this am.   04/01/20 with frequent PVCs (24.2%) and NSVT longest 8 beats 04/03/20 LVEF 55-60%, mildly dilated aortic root, no significant valve disease 05/29/20 Myoview with sustained VT lasing over 35 seconds noted during exercise (stage 2 of bruce protocol) 05/30/20 LHC with normal coronaries  Past Medical History:  Diagnosis Date  . Arthritis    lower back  . Asthma   . Skin cancer 04/13/2016   left neck  . Wears contact lenses      Surgical History:  Past Surgical History:  Procedure Laterality Date  . COLONOSCOPY    . ETHMOIDECTOMY Bilateral 11/10/2016   Procedure: ETHMOIDECTOMY;  Surgeon: Clyde Canterbury, MD;  Location: [place];  Service: ENT;  Laterality: Bilateral;  .  FRONTAL SINUS EXPLORATION Bilateral 11/10/2016   Procedure: FRONTAL SINUS EXPLORATION;  Surgeon: Clyde Canterbury, MD;  Location: [place];  Service: ENT;  Laterality: Bilateral;  . IMAGE GUIDED SINUS SURGERY N/A 11/10/2016   Procedure: IMAGE GUIDED SINUS SURGERY;  Surgeon: Clyde Canterbury, MD;  Location: [place];  Service: ENT;  Laterality: N/A;  GAVE DISK TO CECE 10-23  . LEFT HEART CATH AND CORONARY ANGIOGRAPHY N/A 05/30/2020   Procedure: LEFT HEART CATH AND CORONARY ANGIOGRAPHY;   Surgeon: Minna Merritts, MD;  Location: [place] [facility];  Service: Cardiovascular;  Laterality: N/A;  . MAXILLARY ANTROSTOMY Bilateral 11/10/2016   Procedure: MAXILLARY ANTROSTOMY WITH TISSUE REMOVAL;  Surgeon: Clyde Canterbury, MD;  Location: [place];  Service: ENT;  Laterality: Bilateral;  . SPHENOIDECTOMY Bilateral 11/10/2016   Procedure: SPHENOIDECTOMY;  Surgeon: Clyde Canterbury, MD;  Location: [place];  Service: ENT;  Laterality: Bilateral;     (Not in a hospital admission)   Inpatient Medications:   Allergies:  Allergies  Allergen Reactions  . Pecan Nut (Diagnostic) Shortness Of Breath    All tree nuts cause throat swelling and "asthma-like" reaction  . Quinolones Other (See Comments)    Fluoroquinolones not recommended in patients with aortic root dilation    Social History   Socioeconomic History  . Marital status: Married    Spouse name: Not on file  . Number of children: Not on file  . Years of education: Not on file  . Highest education level: Not on file  Occupational History  . Not on file  Tobacco Use  . Smoking status: Former Smoker    Types: Cigarettes    Quit date: 2008    Years since quitting: 14.4  . Smokeless tobacco: Never Used  Vaping Use  . Vaping Use: Never used  Substance and Sexual Activity  . Alcohol use: Yes    Alcohol/week: 14.0 standard drinks    Types: 14 Cans of beer per week    Comment: 1-2 beers per days 5-6 days per week  . Drug use: Never  . Sexual activity: Not on file  Other Topics Concern  . Not on file  Social History Narrative  . Not on file   Social Determinants of Health   Financial Resource Strain: Not on file  Food Insecurity: Not on file  Transportation Needs: Not on file  Physical Activity: Not on file  Stress: Not on file  Social Connections: Not on file  Intimate Partner Violence: Not on file     Family History  Problem Relation Age of Onset  . Diabetes Mother      Review of  Systems: All other systems reviewed and are otherwise negative except as noted above.  Physical Exam: Vitals:   06/11/20 0805 06/11/20 0812 06/11/20 0815 06/11/20 0900  BP: 137/74  (!) 122/59 110/60  Pulse:   73 78  Resp: (!) 27  14 16   Temp:      SpO2: 98%  98% 95%  Weight:  97.5 kg    Height:  5\' 10"  (1.778 m)      GEN- The patient is well appearing, alert and oriented x 3 today.   HEENT: normocephalic, atraumatic; sclera clear, conjunctiva pink; hearing intact; oropharynx clear; neck supple Lungs- Clear to ausculation bilaterally, normal work of breathing.  No wheezes, rales, rhonchi Heart- Regular rate and rhythm, no murmurs, rubs or gallops GI- soft, non-tender, non-distended, bowel sounds present Extremities- no clubbing, cyanosis, or edema; DP/PT/radial pulses 2+  bilaterally MS- no significant deformity or atrophy Skin- warm and dry, no rash or lesion Psych- euthymic mood, full affect Neuro- strength and sensation are intact  Labs:   Lab Results  Component Value Date   WBC 7.0 06/11/2020   HGB 14.8 06/11/2020   HCT 44.6 06/11/2020   MCV 93.9 06/11/2020   PLT 283 06/11/2020    Recent Labs  Lab 06/11/20 0825  NA 136  K 4.4  CL 105  CO2 24  BUN 11  CREATININE 0.90  CALCIUM 8.8*  GLUCOSE 118*      Radiology/Studies: CARDIAC CATHETERIZATION  Result Date: 05/30/2020  There is no aortic valve stenosis.    NM Myocar Single W/Spect W/Wall Motion And EF  Result Date: 05/29/2020  Blood pressure demonstrated a normal response to exercise.  There was no ST segment deviation noted during stress.  There was sustained VT lasting over 35 secs noted during exercise (stage 2 of bruce protocol).  Stress test is abnormal due to significant arrhythmia  Recommend left heart cath to evaluate ischemic source  Will start toprol xl 25mg  qd, and place cardiac monitor  Consider EP eval if no obstructive CAD on left heart cath    [facility] 1 View  Result Date:  06/11/2020 CLINICAL DATA:  Ventricular tachycardia EXAM: PORTABLE CHEST 1 VIEW COMPARISON:  None. FINDINGS: Lungs are clear. The heart is upper normal in size with pulmonary vascularity normal. No adenopathy. No pneumothorax. No bone lesions. Monitor device on the left. IMPRESSION: Lungs clear.  Heart upper normal in size. Electronically Signed   By: Lowella Grip III M.D.   On: 06/11/2020 08:32    EKG: NSR in 70s with frequent PVCs in couplets and bigeminy (2 separate EKGs) (personally reviewed)  TELEMETRY: NSR in 70s underlying, with frequent PVCs in couplets and bigeminy/trigeminy (personally reviewed)  Assessment/Plan: 1.  Symptomatic NSVT / Frequent PVCs Normal EF and no ischemia by recent cath Symptomatic with lightheadedness , hard palpitations, and mild SOB  Denies any history of syncope.  Will get cMRI this afternoon and if no scar or arrhythmogenic picture, will see again as outpatient to discuss EPS + PVC ablation per Dr. Quentin Ore Electrolytes normal on arrival.   Confirmed cMRI can be done today. If stable, would potentially plan for home this evening.   For questions or updates, please contact [place] Please consult [email or web address] for contact info under Cardiology/STEMI.  Jacalyn Lefevre, PA-C  06/11/2020 9:48 AM

## 2020-06-11 NOTE — ED Triage Notes (Addendum)
Pt arrived via POV, sent by cardiologist, because pt is wearing heart monitor that he's supposed to wear for 2 wks and he has had it on for a week now and it showed he's gone into v-tach. Pt states at the time he was doing yard work yesterday and he felt his "heart racing and felt dizzy, so I hit the button on the heart monitor." Pt denies chest pain and dizziness at present. Pt is sinus with bigeminy on monitor. Pt is A&Ox4. VSS

## 2020-06-11 NOTE — Discharge Instructions (Addendum)
If you pass out, you need to go to the closest emergency room.  Do not exercise until you see Dr. Quentin Ore next week.

## 2020-06-11 NOTE — ED Provider Notes (Signed)
[facility] EMERGENCY DEPARTMENT Provider Note   CSN: [id number] Arrival date & time: 06/11/20  0753     History Chief Complaint  Patient presents with  . sent by dr    Kyle Kim is a 60 y.o. male.  Pt presents to the ED today with episodes of vtach.  Pt was initially seen at cardiology in March with palpitations and PVCs.  Pt said he's had palpitations for years.  He was found to have a high burden of NSVT and PVCs.  Lopressor was added, but then taken off his med list.  He had a stress test on 5/18 which was terminated due to VT.  He converted to NSR without intervention.  He was restarted on Toprol XL 25 mg daily.  He had a cath on 5/19 which showed normal coronary arteries.  He was referred to EP with a consult for 6/22.  He's been wearing a holter monitor since then which showed vtach yesterday.  At the time, he was doing yard work.  He felt his heart racing and felt dizzy.  He had to go inside and rest.  He does feel these episodes.  Pt was recommended to go to the ED yesterday, but came in today.  His Metoprolol was increased to 50 mg daily.  Pt denies any sx now.        Past Medical History:  Diagnosis Date  . Arthritis    lower back  . Asthma   . Skin cancer 04/13/2016   left neck  . Wears contact lenses     Patient Active Problem List   Diagnosis Date Noted  . Angina pectoris ([place]) 05/30/2020  . VT (ventricular tachycardia) ([place]) 05/30/2020  . Near syncope 05/30/2020    Past Surgical History:  Procedure Laterality Date  . COLONOSCOPY    . ETHMOIDECTOMY Bilateral 11/10/2016   Procedure: ETHMOIDECTOMY;  Surgeon: Clyde Canterbury, MD;  Location: [place];  Service: ENT;  Laterality: Bilateral;  . FRONTAL SINUS EXPLORATION Bilateral 11/10/2016   Procedure: FRONTAL SINUS EXPLORATION;  Surgeon: Clyde Canterbury, MD;  Location: [place];  Service: ENT;  Laterality: Bilateral;  . IMAGE GUIDED SINUS SURGERY N/A 11/10/2016    Procedure: IMAGE GUIDED SINUS SURGERY;  Surgeon: Clyde Canterbury, MD;  Location: [place];  Service: ENT;  Laterality: N/A;  GAVE DISK TO [facility] 10-23  . LEFT HEART CATH AND CORONARY ANGIOGRAPHY N/A 05/30/2020   Procedure: LEFT HEART CATH AND CORONARY ANGIOGRAPHY;  Surgeon: Minna Merritts, MD;  Location: [place] [facility];  Service: Cardiovascular;  Laterality: N/A;  . MAXILLARY ANTROSTOMY Bilateral 11/10/2016   Procedure: MAXILLARY ANTROSTOMY WITH TISSUE REMOVAL;  Surgeon: Clyde Canterbury, MD;  Location: [place];  Service: ENT;  Laterality: Bilateral;  . SPHENOIDECTOMY Bilateral 11/10/2016   Procedure: SPHENOIDECTOMY;  Surgeon: Clyde Canterbury, MD;  Location: [place];  Service: ENT;  Laterality: Bilateral;       Family History  Problem Relation Age of Onset  . Diabetes Mother     Social History   Tobacco Use  . Smoking status: Former Smoker    Types: Cigarettes    Quit date: 2008    Years since quitting: 14.4  . Smokeless tobacco: Never Used  Vaping Use  . Vaping Use: Never used  Substance Use Topics  . Alcohol use: Yes    Alcohol/week: 14.0 standard drinks    Types: 14 Cans of beer per week    Comment: 1-2 beers per days 5-6  days per week  . Drug use: Never    Home Medications Prior to Admission medications   Medication Sig Start Date End Date Taking? Authorizing Provider  acetaminophen (TYLENOL) 500 MG tablet Take 1,000 mg by mouth every 6 (six) hours as needed for moderate pain or mild pain.   Yes [provider]  albuterol (PROVENTIL HFA;VENTOLIN HFA) 108 (90 Base) MCG/ACT inhaler Inhale 2 puffs into the lungs every 6 (six) hours as needed for wheezing or shortness of breath.   Yes [provider]  aspirin 325 MG tablet Take 650 mg by mouth every 6 (six) hours as needed for moderate pain or mild pain.   Yes [provider]  cyclobenzaprine (FLEXERIL) 5 MG tablet Take 5 mg by mouth 3 (three) times daily as needed  for muscle spasms.   Yes [provider]  fluticasone (FLONASE) 50 MCG/ACT nasal spray Place 2 sprays into both nostrils 2 (two) times daily. 02/19/20  Yes [provider]  ibuprofen (ADVIL) 200 MG tablet Take 400 mg by mouth every 8 (eight) hours as needed for moderate pain or mild pain.   Yes [provider]  metoprolol succinate (TOPROL XL) 100 MG 24 hr tablet Take 1 tablet (100 mg total) by mouth daily. Take with or immediately following a meal. 06/11/20  Yes Isla Pence, MD  montelukast (SINGULAIR) 10 MG tablet Take 10 mg by mouth daily. 08/14/19  Yes [provider]  sildenafil (VIAGRA) 50 MG tablet Take 50 mg by mouth as needed for erectile dysfunction. 02/14/20  Yes [provider]    Allergies    Pecan nut (diagnostic) and Quinolones  Review of Systems   Review of Systems  Cardiovascular: Positive for palpitations.  All other systems reviewed and are negative.   Physical Exam Updated Vital Signs BP 120/73   Pulse (!) 58   Temp 98 F (36.7 C) (Oral)   Resp 10   Ht 5\' 10"  (1.778 m)   Wt 97.5 kg   SpO2 97%   BMI 30.85 kg/m   Physical Exam Vitals and nursing note reviewed.  Constitutional:      Appearance: Normal appearance.  HENT:     Head: Normocephalic and atraumatic.     Right Ear: External ear normal.     Left Ear: External ear normal.     Nose: Nose normal.     Mouth/Throat:     Mouth: Mucous membranes are moist.     Pharynx: Oropharynx is clear.  Eyes:     Extraocular Movements: Extraocular movements intact.     Conjunctiva/sclera: Conjunctivae normal.     Pupils: Pupils are equal, round, and reactive to light.  Cardiovascular:     Rate and Rhythm: Normal rate. Rhythm irregular.     Pulses: Normal pulses.     Heart sounds: Normal heart sounds.     Comments: Frequent PVCs on the monitor Pulmonary:     Effort: Pulmonary effort is normal.     Breath sounds: Normal breath sounds.  Abdominal:     General: Abdomen  is flat. Bowel sounds are normal.     Palpations: Abdomen is soft.  Musculoskeletal:        General: Normal range of motion.     Cervical back: Normal range of motion and neck supple.  Skin:    General: Skin is warm.     Capillary Refill: Capillary refill takes less than 2 seconds.  Neurological:     General: No focal deficit present.  Mental Status: He is alert and oriented to person, place, and time.  Psychiatric:        Mood and Affect: Mood normal.        Behavior: Behavior normal.     ED Results / Procedures / Treatments   Labs (all labs ordered are listed, but only abnormal results are displayed) Labs Reviewed  BASIC METABOLIC PANEL - Abnormal; Notable for the following components:      Result Value   Glucose, Bld 118 (*)    Calcium 8.8 (*)    All other components within normal limits  RESP PANEL BY RT-PCR (FLU A&B, COVID) ARPGX2  MAGNESIUM  CBC  TSH  TROPONIN I (HIGH SENSITIVITY)  TROPONIN I (HIGH SENSITIVITY)    EKG EKG Interpretation  Date/Time:  Tuesday Jun 11 2020 08:15:32 EDT Ventricular Rate:  105 PR Interval:  216 QRS Duration: 98 QT Interval:  389 QTC Calculation: 395 R Axis:   33 Text Interpretation: Sinus tachycardia Paired ventricular premature complexes Prolonged PR interval Confirmed by Isla Pence [phone]) on 06/11/2020 8:30:30 AM   Radiology DG Chest Port 1 View  Result Date: 06/11/2020 CLINICAL DATA:  Ventricular tachycardia EXAM: PORTABLE CHEST 1 VIEW COMPARISON:  None. FINDINGS: Lungs are clear. The heart is upper normal in size with pulmonary vascularity normal. No adenopathy. No pneumothorax. No bone lesions. Monitor device on the left. IMPRESSION: Lungs clear.  Heart upper normal in size. Electronically Signed   By: Lowella Grip III M.D.   On: 06/11/2020 08:32    Procedures Procedures   Medications Ordered in ED Medications  LORazepam (ATIVAN) tablet 0.5 mg (has no administration in time range)  LORazepam (ATIVAN)  injection 0.5 mg (has no administration in time range)  LORazepam (ATIVAN) injection 1 mg (has no administration in time range)  metoprolol succinate (TOPROL-XL) 24 hr tablet 50 mg (has no administration in time range)  gadobutrol (GADAVIST) 1 MMOL/ML injection 10 mL (10 mLs Intravenous Contrast Given 06/11/20 1440)    ED Course  I have reviewed the triage vital signs and the nursing notes.  Pertinent labs & imaging results that were available during my care of the patient were reviewed by me and considered in my medical decision making (see chart for details).    MDM Rules/Calculators/A&P                          Pt's labs are nl.  Pt is d/w EP who came to see pt in the ED.  The plan is to get a cardiac MRI.  If there is no indication for an AICD, he will go home and f/u as an outpatient.  Cardiac MRI without scar.  EP recommended increasing Toprol XL to 100 mg daily.  Appt with Dr. Quentin Ore moved up to 6/8.  Pt is good with the plan.  He is to return if worse.   Final Clinical Impression(s) / ED Diagnoses Final diagnoses:  Premature ventricular contractions (PVCs) ([facility])  Ventricular tachycardia ([place])    Rx / DC Orders ED Discharge Orders         Ordered    metoprolol succinate (TOPROL XL) 100 MG 24 hr tablet  Daily        06/11/20 1612           Isla Pence, MD 06/11/20 1615

## 2020-06-11 NOTE — ED Notes (Signed)
Pt provided warm blankets. Notified provider about pt getting a meal. The provider said they will need to contact the PA for further information. At the moment pt is NPO

## 2020-06-11 NOTE — ED Notes (Signed)
Patient transported to MRI 

## 2020-06-18 ENCOUNTER — Telehealth: Payer: Self-pay | Admitting: Cardiology

## 2020-06-18 NOTE — Telephone Encounter (Signed)
  Patient Consent for Virtual Visit          Kyle Kim has provided verbal consent on 06/18/2020 for a virtual visit (video or telephone).   CONSENT FOR VIRTUAL VISIT FOR:  Kyle Kim  By participating in this virtual visit I agree to the following:  I hereby voluntarily request, consent and authorize [facility] and its employed or contracted physicians, physician assistants, nurse practitioners or other licensed health care professionals (the Practitioner), to provide me with telemedicine health care services (the "Services") as deemed necessary by the treating Practitioner. I acknowledge and consent to receive the Services by the Practitioner via telemedicine. I understand that the telemedicine visit will involve communicating with the Practitioner through live audiovisual communication technology and the disclosure of certain medical information by electronic transmission. I acknowledge that I have been given the opportunity to request an in-person assessment or other available alternative prior to the telemedicine visit and am voluntarily participating in the telemedicine visit.  I understand that I have the right to withhold or withdraw my consent to the use of telemedicine in the course of my care at any time, without affecting my right to future care or treatment, and that the Practitioner or I may terminate the telemedicine visit at any time. I understand that I have the right to inspect all information obtained and/or recorded in the course of the telemedicine visit and may receive copies of available information for a reasonable fee.  I understand that some of the potential risks of receiving the Services via telemedicine include:  Marland Kitchen Delay or interruption in medical evaluation due to technological equipment failure or disruption; . Information transmitted may not be sufficient (e.g. poor resolution of images) to allow for appropriate medical decision making by the Practitioner;  and/or  . In rare instances, security protocols could fail, causing a breach of personal health information.  Furthermore, I acknowledge that it is my responsibility to provide information about my medical history, conditions and care that is complete and accurate to the best of my ability. I acknowledge that Practitioner's advice, recommendations, and/or decision may be based on factors not within their control, such as incomplete or inaccurate data provided by me or distortions of diagnostic images or specimens that may result from electronic transmissions. I understand that the practice of medicine is not an exact science and that Practitioner makes no warranties or guarantees regarding treatment outcomes. I acknowledge that a copy of this consent can be made available to me via my patient portal ([place]), or I can request a printed copy by calling the office of [facility].    I understand that my insurance will be billed for this visit.   I have read or had this consent read to me. . I understand the contents of this consent, which adequately explains the benefits and risks of the Services being provided via telemedicine.  . I have been provided ample opportunity to ask questions regarding this consent and the Services and have had my questions answered to my satisfaction. . I give my informed consent for the services to be provided through the use of telemedicine in my medical care

## 2020-06-19 ENCOUNTER — Institutional Professional Consult (permissible substitution): Payer: No Typology Code available for payment source | Admitting: Cardiology

## 2020-06-19 ENCOUNTER — Encounter: Payer: Self-pay | Admitting: Cardiology

## 2020-06-19 ENCOUNTER — Telehealth (INDEPENDENT_AMBULATORY_CARE_PROVIDER_SITE_OTHER): Payer: No Typology Code available for payment source | Admitting: Cardiology

## 2020-06-19 ENCOUNTER — Other Ambulatory Visit: Payer: Self-pay

## 2020-06-19 ENCOUNTER — Ambulatory Visit: Payer: No Typology Code available for payment source | Admitting: Cardiology

## 2020-06-19 DIAGNOSIS — I472 Ventricular tachycardia, unspecified: Secondary | ICD-10-CM

## 2020-06-19 DIAGNOSIS — I493 Ventricular premature depolarization: Secondary | ICD-10-CM

## 2020-06-19 NOTE — Patient Instructions (Addendum)
Medication Instructions:  Your physician recommends that you continue on your current medications as directed. Please refer to the Current Medication list given to you today. *If you need a refill on your cardiac medications before your next appointment, please call your pharmacy*  Lab Work: None ordered. If you have labs (blood work) drawn today and your tests are completely normal, you will receive your results only by: Marland Kitchen MyChart Message (if you have MyChart) OR . A paper copy in the mail If you have any lab test that is abnormal or we need to change your treatment, we will call you to review the results.  Testing/Procedures: You are being referred to Dr. Lattie Corns for a genetic evaluation.  Your physician has recommended that you have an ablation. Catheter ablation is a medical procedure used to treat some cardiac arrhythmias (irregular heartbeats). During catheter ablation, a long, thin, flexible tube is put into a blood vessel in your groin (upper thigh), or neck. This tube is called an ablation catheter. It is then guided to your heart through the blood vessel. Radio frequency waves destroy small areas of heart tissue where abnormal heartbeats may cause an arrhythmia to start. Please see the instruction sheet given to you today.  Follow-Up: At [facility], you and your health needs are our priority.  As part of our continuing mission to provide you with exceptional heart care, we have created designated Provider Care Teams.  These Care Teams include your primary Cardiologist (physician) and Advanced Practice Providers (APPs -  Physician Assistants and Nurse Practitioners) who all work together to provide you with the care you need, when you need it.  Your next appointment:    SEE INSTRUCTION LETTER   Cardiac electrophysiology: From cell to bedside (7th ed., pp. 8295-6213). [place], [place]: Elsevier.">  Cardiac Ablation Cardiac ablation is a procedure to destroy, or ablate, a  small amount of heart tissue in very specific places. The heart has many electrical connections. Sometimes these connections are abnormal and can cause the heart to beat very fast or irregularly. Ablating some of the areas that cause problems can improve the heart's rhythm or return it to normal. Ablation may be done for people who:  Have Wolff-Parkinson-White syndrome.  Have fast heart rhythms (tachycardia).  Have taken medicines for an abnormal heart rhythm (arrhythmia) that were not effective or caused side effects.  Have a high-risk heartbeat that may be life-threatening. During the procedure, a small incision is made in the neck or the groin, and a long, thin tube (catheter) is inserted into the incision and moved to the heart. Small devices (electrodes) on the tip of the catheter will send out electrical currents. A type of X-ray (fluoroscopy) will be used to help guide the catheter and to provide images of the heart. Tell a health care provider about:  Any allergies you have.  All medicines you are taking, including vitamins, herbs, eye drops, creams, and over-the-counter medicines.  Any problems you or family members have had with anesthetic medicines.  Any blood disorders you have.  Any surgeries you have had.  Any medical conditions you have, such as kidney failure.  Whether you are pregnant or may be pregnant. What are the risks? Generally, this is a safe procedure. However, problems may occur, including:  Infection.  Bruising and bleeding at the catheter insertion site.  Bleeding into the chest, especially into the sac that surrounds the heart. This is a serious complication.  Stroke or blood clots.  Damage to  nearby structures or organs.  Allergic reaction to medicines or dyes.  Need for a permanent pacemaker if the normal electrical system is damaged. A pacemaker is a small computer that sends electrical signals to the heart and helps your heart beat  normally.  The procedure not being fully effective. This may not be recognized until months later. Repeat ablation procedures are sometimes done. What happens before the procedure? Medicines Ask your health care provider about:  Changing or stopping your regular medicines. This is especially important if you are taking diabetes medicines or blood thinners.  Taking medicines such as aspirin and ibuprofen. These medicines can thin your blood. Do not take these medicines unless your health care provider tells you to take them.  Taking over-the-counter medicines, vitamins, herbs, and supplements. General instructions  Follow instructions from your health care provider about eating or drinking restrictions.  Plan to have someone take you home from the hospital or clinic.  If you will be going home right after the procedure, plan to have someone with you for 24 hours.  Ask your health care provider what steps will be taken to prevent infection. What happens during the procedure?  An IV will be inserted into one of your veins.  You will be given a medicine to help you relax (sedative).  The skin on your neck or groin will be numbed.  An incision will be made in your neck or your groin.  A needle will be inserted through the incision and into a large vein in your neck or groin.  A catheter will be inserted into the needle and moved to your heart.  Dye may be injected through the catheter to help your surgeon see the area of the heart that needs treatment.  Electrical currents will be sent from the catheter to ablate heart tissue in desired areas. There are three types of energy that may be used to do this: ? Heat (radiofrequency energy). ? Laser energy. ? Extreme cold (cryoablation).  When the tissue has been ablated, the catheter will be removed.  Pressure will be held on the insertion area to prevent a lot of bleeding.  A bandage (dressing) will be placed over the insertion  area. The exact procedure may vary among health care providers and hospitals.   What happens after the procedure?  Your blood pressure, heart rate, breathing rate, and blood oxygen level will be monitored until you leave the hospital or clinic.  Your insertion area will be monitored for bleeding. You will need to lie still for a few hours to ensure that you do not bleed from the insertion area.  Do not drive for 24 hours or as long as told by your health care provider. Summary  Cardiac ablation is a procedure to destroy, or ablate, a small amount of heart tissue using an electrical current. This procedure can improve the heart rhythm or return it to normal.  Tell your health care provider about any medical conditions you may have and all medicines you are taking to treat them.  This is a safe procedure, but problems may occur. Problems may include infection, bruising, damage to nearby organs or structures, or allergic reactions to medicines.  Follow your health care provider's instructions about eating and drinking before the procedure. You may also be told to change or stop some of your medicines.  After the procedure, do not drive for 24 hours or as long as told by your health care provider. This information is not intended  to replace advice given to you by your health care provider. Make sure you discuss any questions you have with your health care provider. Document Revised: 11/07/2018 Document Reviewed: 11/07/2018 Elsevier Patient Education  [place].

## 2020-06-19 NOTE — Progress Notes (Signed)
Virtual Visit via Telephone Note   This visit type was conducted due to national recommendations for restrictions regarding the COVID-19 Pandemic (e.g. social distancing) in an effort to limit this patient's exposure and mitigate transmission in our community.  Due to his co-morbid illnesses, this patient is at least at moderate risk for complications without adequate follow up.  This format is felt to be most appropriate for this patient at this time.  The patient did not have access to video technology/had technical difficulties with video requiring transitioning to audio format only (telephone).  All issues noted in this document were discussed and addressed.  No physical exam could be performed with this format.  Please refer to the patient's chart for his  consent to telehealth for [facility].    Date:  06/19/2020   ID:  Kyle Kim, DOB [birth year], MRN [id number] The patient was identified using 2 identifiers.  Patient Location: Home Provider Location: Home Office   PCP:  Maryland Pink, MD   [facility] Providers Cardiologist:  Ida Rogue, MD     Evaluation Performed:  Follow-Up Visit  Chief Complaint: PVCs  History of Present Illness:    Kyle Kim is a 60 y.o. male with PVCs who presents for follow-up.  I met the patient on Jun 11, 2020 in the emergency department when he presented with nonsustained ventricular tachycardia and very frequent PVCs.  The patient has no evidence of structural heart disease.  He has had a left heart catheterization which showed no significant coronary artery disease.  A cardiac MRI showed no fibrosis or inflammation.  The dimensions of his LV and RV are normal.  His PVCs are monomorphic.  The patient does have symptoms of palpitations that occur when he exerts himself.  He is a very active man and is previously cycled for exercise.  He has held off on cycling since the diagnosis.  No syncope or presyncope.  No ICD history in his  family.  We increase his metoprolol succinate to 50 mg by mouth twice daily.  He tells me that since the increased dose of his metoprolol, he does think the symptoms have improved.  He did tell me that this morning he walked briskly back from the mailbox and noted some palpitations.  No syncope or presyncope since leaving the hospital.     Past Medical History:  Diagnosis Date  . Arthritis    lower back  . Asthma   . Skin cancer 04/13/2016   left neck  . Wears contact lenses    Past Surgical History:  Procedure Laterality Date  . COLONOSCOPY    . ETHMOIDECTOMY Bilateral 11/10/2016   Procedure: ETHMOIDECTOMY;  Surgeon: Clyde Canterbury, MD;  Location: [place];  Service: ENT;  Laterality: Bilateral;  . FRONTAL SINUS EXPLORATION Bilateral 11/10/2016   Procedure: FRONTAL SINUS EXPLORATION;  Surgeon: Clyde Canterbury, MD;  Location: [place];  Service: ENT;  Laterality: Bilateral;  . IMAGE GUIDED SINUS SURGERY N/A 11/10/2016   Procedure: IMAGE GUIDED SINUS SURGERY;  Surgeon: Clyde Canterbury, MD;  Location: [place];  Service: ENT;  Laterality: N/A;  GAVE DISK TO [facility] 10-23  . LEFT HEART CATH AND CORONARY ANGIOGRAPHY N/A 05/30/2020   Procedure: LEFT HEART CATH AND CORONARY ANGIOGRAPHY;  Surgeon: Minna Merritts, MD;  Location: [facility];  Service: Cardiovascular;  Laterality: N/A;  . MAXILLARY ANTROSTOMY Bilateral 11/10/2016   Procedure: MAXILLARY ANTROSTOMY WITH TISSUE REMOVAL;  Surgeon: Clyde Canterbury, MD;  Location: [facility];  Service: ENT;  Laterality: Bilateral;  . SPHENOIDECTOMY Bilateral 11/10/2016   Procedure: SPHENOIDECTOMY;  Surgeon: Clyde Canterbury, MD;  Location: [facility];  Service: ENT;  Laterality: Bilateral;     Current Meds  Medication Sig  . acetaminophen (TYLENOL) 500 MG tablet Take 1,000 mg by mouth every 6 (six) hours as needed for moderate pain or mild pain.  Marland Kitchen albuterol (PROVENTIL HFA;VENTOLIN HFA) 108 (90  Base) MCG/ACT inhaler Inhale 2 puffs into the lungs every 6 (six) hours as needed for wheezing or shortness of breath.  Marland Kitchen aspirin 325 MG tablet Take 650 mg by mouth every 6 (six) hours as needed for moderate pain or mild pain.  . cyclobenzaprine (FLEXERIL) 5 MG tablet Take 5 mg by mouth 3 (three) times daily as needed for muscle spasms.  . fluticasone (FLONASE) 50 MCG/ACT nasal spray Place 2 sprays into both nostrils 2 (two) times daily.  Marland Kitchen ibuprofen (ADVIL) 200 MG tablet Take 400 mg by mouth every 8 (eight) hours as needed for moderate pain or mild pain.  . metoprolol succinate (TOPROL XL) 100 MG 24 hr tablet Take 1 tablet (100 mg total) by mouth daily. Take with or immediately following a meal.  . montelukast (SINGULAIR) 10 MG tablet Take 10 mg by mouth daily.  . sildenafil (VIAGRA) 50 MG tablet Take 50 mg by mouth as needed for erectile dysfunction.     Allergies:   Pecan nut (diagnostic) and Quinolones   Social History   Tobacco Use  . Smoking status: Former Smoker    Types: Cigarettes    Quit date: 2008    Years since quitting: 14.4  . Smokeless tobacco: Never Used  Vaping Use  . Vaping Use: Never used  Substance Use Topics  . Alcohol use: Yes    Alcohol/week: 14.0 standard drinks    Types: 14 Cans of beer per week    Comment: 1-2 beers per days 5-6 days per week  . Drug use: Never     Family Hx: The patient's family history includes Diabetes in his mother.  ROS:   Please see the history of present illness.     All other systems reviewed and are negative.   Prior CV studies:   The following studies were reviewed today:  Jun 11, 2020 cardiac MRI IMPRESSION: 1. Normal LV size and systolic function (EF 69%), though cardiac motion artifact from frequent PVCs can affect quantification of volumes  2.  Normal RV size and systolic function (EF 48%)  3.  No late gadolinium enhancement to suggest myocardial scar  4. Normal global and regional T2 values suggests no  myocardial Inflammation   May 30, 2020 left heart catheterization Coronary dominance: Left dominant  Left mainstem:   Large vessel that bifurcates into the LAD and left circumflex, no significant disease noted  Left anterior descending (LAD):   Large vessel that extends to the apical region, diagonal branch 2 of moderate size, no significant disease noted  Left circumflex (LCx):  Large vessel with OM branch 2, no significant disease noted  Right coronary artery (RCA):  Right nondominant vessel with PL and PDA, no significant disease noted  Left ventriculography: no significant aortic valve stenosis    April 03, 2020 echo personally reviewed Left ventricular function normal, 55% Right ventricular function normal Mild MR Mild dilation of the aortic root, 39 mm  Jun 11, 2020 EKG Monomorphic PVCs with a steeply inferior axis, precordial transition preceding sinus rhythm precordial transition   Labs/Other Tests  and Data Reviewed:      Recent Labs: 06/11/2020: BUN 11; Creatinine, Ser 0.90; Hemoglobin 14.8; Magnesium 2.0; Platelets 283; Potassium 4.4; Sodium 136; TSH 2.945   Recent Lipid Panel No results found for: CHOL, TRIG, HDL, CHOLHDL, LDLCALC, LDLDIRECT  Wt Readings from Last 3 Encounters:  06/11/20 215 lb (97.5 kg)  05/30/20 215 lb (97.5 kg)  05/02/20 215 lb (97.5 kg)      Objective:    Vital Signs:  There were no vitals taken for this visit.    ASSESSMENT & PLAN:    1. Frequent monomorphic PVCs and NSVT Structurally normal heart.  No fibrosis or inflammation on cardiac MRI.  Very high burden of monomorphic PVCs originating from the outflow tracts.  Incomplete control on an acceptable dose beta-blocker.  I would recommend that we pursue EP study and ablation for possible definitive management of his PVCs.  I discussed the EP study and ablation procedure in detail with the patient during today's visit including the risks, efficacy and expected recovery time.  I  discussed the possibility that we can localize the PVC but not ablate given adjacent structures.  I discussed the possibility of needing antiarrhythmic therapy in the future.  Five days prior to the procedure he would decrease his metoprolol to 25 mg once a day for 2 days and then stop his metoprolol altogether for 3 days prior to the procedure.  Will likely require mapping of the left ventricular outflow tract as well as the right ventricular outflow tract.  I would also like to refer him to genetics given the very high burden of PVCs, substantial burden of NSVT and link to exertion.  Would like to make sure that were not catching a inherited cardiomyopathy prior to its manifestation on cardiac imaging.  Given the patient's structurally normal heart without evidence of fibrosis on cardiac MRI and the hemodynamic stability during salvos of NSVT, I do not think an ICD is indicated.  Therapeutic strategies for PVC/VT including medicine and ablation were discussed in detail with the patient today. Risk, benefits, and alternatives to EP study and radiofrequency ablation were also discussed in detail today. These risks include but are not limited to stroke, bleeding, vascular damage, tamponade, perforation, damage to the heart and other structures, AV block requiring pacemaker, worsening renal function, and death. The patient understands these risk and wishes to proceed.  We will therefore proceed with catheter ablation at the next available time.    Time:   Today, I have spent 25 minutes with the patient with telehealth technology discussing the above problems.     Medication Adjustments/Labs and Tests Ordered: Current medicines are reviewed at length with the patient today.  Concerns regarding medicines are outlined above.   Tests Ordered: No orders of the defined types were placed in this encounter.   Medication Changes: No orders of the defined types were placed in this  encounter.   Signed, Vickie Epley, MD  06/19/2020 10:04 AM    [place]

## 2020-06-20 ENCOUNTER — Telehealth: Payer: Self-pay | Admitting: Cardiology

## 2020-06-20 DIAGNOSIS — Z0279 Encounter for issue of other medical certificate: Secondary | ICD-10-CM

## 2020-06-20 NOTE — Telephone Encounter (Signed)
Received [facility] fmla forms from patient via office walk in.    Patient completed ROI packet and patient $29 fee for processing.    Placed in nurse box for completion.

## 2020-06-25 ENCOUNTER — Encounter: Payer: No Typology Code available for payment source | Admitting: Genetic Counselor

## 2020-06-26 NOTE — Addendum Note (Signed)
Encounter addended by: Jeannette How on: 06/26/2020 10:13 AM  Actions taken: Imaging Exam ended

## 2020-06-26 NOTE — Telephone Encounter (Signed)
Paperwork completed & signed. Left a checkout desk in [place] office.

## 2020-06-27 NOTE — Telephone Encounter (Signed)
Forms completed .  Scanned to Documents.   Spoke with patient.  Per request faxed to number provided and placed original at front desk for pick up.

## 2020-07-03 ENCOUNTER — Institutional Professional Consult (permissible substitution): Payer: No Typology Code available for payment source | Admitting: Cardiology

## 2020-07-08 MED ORDER — METOPROLOL SUCCINATE ER 100 MG PO TB24: 100.0000 mg | ORAL_TABLET | Freq: Every day | ORAL | 11 refills | Status: DC

## 2020-07-17 ENCOUNTER — Other Ambulatory Visit
Admission: RE | Admit: 2020-07-17 | Discharge: 2020-07-17 | Disposition: A | Discharge status: Home / Self Care | Payer: No Typology Code available for payment source | Attending: Cardiology | Admitting: Cardiology

## 2020-07-17 DIAGNOSIS — I493 Ventricular premature depolarization: Secondary | ICD-10-CM | POA: Diagnosis present

## 2020-07-17 DIAGNOSIS — I472 Ventricular tachycardia, unspecified: Secondary | ICD-10-CM

## 2020-07-17 LAB — CBC WITH DIFFERENTIAL/PLATELET
Abs Immature Granulocytes: 0.01 10*3/uL (ref 0.00–0.07)
Basophils Absolute: 0.1 10*3/uL (ref 0.0–0.1)
Basophils Relative: 1 %
Eosinophils Absolute: 0.2 10*3/uL (ref 0.0–0.5)
Eosinophils Relative: 3 %
HCT: 42.5 % (ref 39.0–52.0)
Hemoglobin: 14.7 g/dL (ref 13.0–17.0)
Immature Granulocytes: 0 %
Lymphocytes Relative: 45 %
Lymphs Abs: 3.2 10*3/uL (ref 0.7–4.0)
MCH: 31.5 pg (ref 26.0–34.0)
MCHC: 34.6 g/dL (ref 30.0–36.0)
MCV: 91 fL (ref 80.0–100.0)
Monocytes Absolute: 0.8 10*3/uL (ref 0.1–1.0)
Monocytes Relative: 11 %
Neutro Abs: 2.8 10*3/uL (ref 1.7–7.7)
Neutrophils Relative %: 40 %
Platelets: 290 10*3/uL (ref 150–400)
RBC: 4.67 MIL/uL (ref 4.22–5.81)
RDW: 12.8 % (ref 11.5–15.5)
WBC: 7.1 10*3/uL (ref 4.0–10.5)
nRBC: 0 % (ref 0.0–0.2)

## 2020-07-17 LAB — BASIC METABOLIC PANEL
Anion gap: 9 (ref 5–15)
BUN: 16 mg/dL (ref 6–20)
CO2: 23 mmol/L (ref 22–32)
Calcium: 9 mg/dL (ref 8.9–10.3)
Chloride: 105 mmol/L (ref 98–111)
Creatinine, Ser: 0.86 mg/dL (ref 0.61–1.24)
GFR, Estimated: >60 mL/min (ref >60)
Glucose, Bld: 100 mg/dL — ABNORMAL HIGH (ref 70–99)
Potassium: 3.8 mmol/L (ref 3.5–5.1)
Sodium: 137 mmol/L (ref 135–145)

## 2020-07-19 NOTE — Pre-Procedure Instructions (Signed)
Instructed patient on the following items: °Arrival time 0530 °Nothing to eat or drink after midnight °No meds AM of procedure °Responsible person to drive you home and stay with you for 24 hrs ° ° °   °

## 2020-07-21 ENCOUNTER — Encounter (HOSPITAL_COMMUNITY): Payer: Self-pay | Admitting: Cardiology

## 2020-07-21 NOTE — Anesthesia Preprocedure Evaluation (Addendum)
Anesthesia Evaluation  Patient identified by MRN, date of birth, ID band Patient awake    Reviewed: Allergy & Precautions, NPO status , Patient's Chart, lab work & pertinent test results  Airway Mallampati: I  TM Distance: >3 FB Neck ROM: Full    Dental no notable dental hx. (+) Teeth Intact, Caps, Dental Advisory Given   Pulmonary asthma , former smoker,    Pulmonary exam normal breath sounds clear to auscultation       Cardiovascular + angina + dysrhythmias Ventricular Tachycardia  Rhythm:Irregular Rate:Bradycardia  PVC's Hx/o NS VT   Neuro/Psych negative neurological ROS  negative psych ROS   GI/Hepatic negative GI ROS, Neg liver ROS,   Endo/Other  negative endocrine ROS  Renal/GU negative Renal ROS  negative genitourinary   Musculoskeletal  (+) Arthritis , Osteoarthritis,  Low back pain   Abdominal   Peds  Hematology negative hematology ROS (+)   Anesthesia Other Findings   Reproductive/Obstetrics                           Anesthesia Physical Anesthesia Plan  ASA: 2  Anesthesia Plan: MAC   Post-op Pain Management:    Induction: Intravenous  PONV Risk Score and Plan: 2 and Treatment may vary due to age or medical condition and Ondansetron  Airway Management Planned: Natural Airway and Simple Face Mask  Additional Equipment:   Intra-op Plan:   Post-operative Plan:   Informed Consent: I have reviewed the patients History and Physical, chart, labs and discussed the procedure including the risks, benefits and alternatives for the proposed anesthesia with the patient or authorized representative who has indicated his/her understanding and acceptance.     Dental advisory given  Plan Discussed with: CRNA and Anesthesiologist  Anesthesia Plan Comments:       Anesthesia Quick Evaluation

## 2020-07-22 ENCOUNTER — Encounter (HOSPITAL_COMMUNITY): Payer: Self-pay | Admitting: Cardiology

## 2020-07-22 ENCOUNTER — Ambulatory Visit (HOSPITAL_COMMUNITY)
Admission: RE | Admit: 2020-07-22 | Discharge: 2020-07-23 | Disposition: A | Discharge status: Home / Self Care | Payer: No Typology Code available for payment source | Attending: Cardiology | Admitting: Cardiology

## 2020-07-22 ENCOUNTER — Ambulatory Visit (HOSPITAL_COMMUNITY): Payer: No Typology Code available for payment source | Admitting: Anesthesiology

## 2020-07-22 ENCOUNTER — Other Ambulatory Visit: Payer: Self-pay

## 2020-07-22 ENCOUNTER — Ambulatory Visit (HOSPITAL_COMMUNITY)
Admission: RE | Disposition: A | Discharge status: Home / Self Care | Payer: Self-pay | Source: Home / Self Care | Attending: Cardiology

## 2020-07-22 DIAGNOSIS — I472 Ventricular tachycardia, unspecified: Secondary | ICD-10-CM

## 2020-07-22 DIAGNOSIS — Z888 Allergy status to other drugs, medicaments and biological substances status: Secondary | ICD-10-CM | POA: Insufficient documentation

## 2020-07-22 DIAGNOSIS — I493 Ventricular premature depolarization: Secondary | ICD-10-CM | POA: Insufficient documentation

## 2020-07-22 DIAGNOSIS — Z79899 Other long term (current) drug therapy: Secondary | ICD-10-CM | POA: Insufficient documentation

## 2020-07-22 DIAGNOSIS — Z20822 Contact with and (suspected) exposure to covid-19: Secondary | ICD-10-CM | POA: Diagnosis not present

## 2020-07-22 DIAGNOSIS — Z91010 Allergy to peanuts: Secondary | ICD-10-CM | POA: Diagnosis not present

## 2020-07-22 DIAGNOSIS — Z87891 Personal history of nicotine dependence: Secondary | ICD-10-CM | POA: Insufficient documentation

## 2020-07-22 HISTORY — PX: PVC ABLATION: EP1236

## 2020-07-22 LAB — SARS CORONAVIRUS 2 BY RT PCR (HOSPITAL ORDER, PERFORMED IN ~~LOC~~ HOSPITAL LAB): SARS Coronavirus 2: NEGATIVE

## 2020-07-22 SURGERY — PVC ABLATION
Anesthesia: Monitor Anesthesia Care

## 2020-07-22 MED ORDER — FENTANYL CITRATE (PF) 100 MCG/2ML IJ SOLN
INTRAMUSCULAR | Status: DC | PRN
  Administered 2020-07-22 (×3): 25 ug via INTRAVENOUS

## 2020-07-22 MED ORDER — HEPARIN SODIUM (PORCINE) 1000 UNIT/ML IJ SOLN
INTRAMUSCULAR | Status: AC
  Filled 2020-07-22: qty 1

## 2020-07-22 MED ORDER — FLECAINIDE ACETATE 100 MG PO TABS
100.0000 mg | ORAL_TABLET | Freq: Two times a day (BID) | ORAL | Status: DC
  Administered 2020-07-22 – 2020-07-23 (×2): 100 mg via ORAL
  Filled 2020-07-22 (×2): qty 1

## 2020-07-22 MED ORDER — FENTANYL CITRATE (PF) 100 MCG/2ML IJ SOLN: 25.0000 ug | INTRAMUSCULAR | Status: DC | PRN

## 2020-07-22 MED ORDER — SODIUM CHLORIDE 0.9 % IV SOLN: 250.0000 mL | INTRAVENOUS | Status: DC | PRN

## 2020-07-22 MED ORDER — SODIUM CHLORIDE 0.9 % IV SOLN: INTRAVENOUS | Status: DC

## 2020-07-22 MED ORDER — HEPARIN (PORCINE) IN NACL 1000-0.9 UT/500ML-% IV SOLN
INTRAVENOUS | Status: AC
  Filled 2020-07-22: qty 500

## 2020-07-22 MED ORDER — ISOPROTERENOL HCL 0.2 MG/ML IJ SOLN
INTRAVENOUS | Status: DC | PRN
  Administered 2020-07-22: 2 ug/min via INTRAVENOUS

## 2020-07-22 MED ORDER — PROPOFOL 500 MG/50ML IV EMUL
INTRAVENOUS | Status: DC | PRN
  Administered 2020-07-22: 100 ug/kg/min via INTRAVENOUS

## 2020-07-22 MED ORDER — HEPARIN SODIUM (PORCINE) 1000 UNIT/ML IJ SOLN
INTRAMUSCULAR | Status: DC | PRN
  Administered 2020-07-22: 5000 [IU] via INTRAVENOUS
  Administered 2020-07-22: 4000 [IU] via INTRAVENOUS
  Administered 2020-07-22: 12000 [IU] via INTRAVENOUS
  Administered 2020-07-22: 5000 [IU] via INTRAVENOUS

## 2020-07-22 MED ORDER — SODIUM CHLORIDE 0.9% FLUSH: 3.0000 mL | Freq: Two times a day (BID) | INTRAVENOUS | Status: DC

## 2020-07-22 MED ORDER — HEPARIN SODIUM (PORCINE) 1000 UNIT/ML IJ SOLN
INTRAMUSCULAR | Status: DC | PRN
  Administered 2020-07-22: 1000 [IU] via INTRAVENOUS

## 2020-07-22 MED ORDER — ONDANSETRON HCL 4 MG/2ML IJ SOLN: 4.0000 mg | Freq: Four times a day (QID) | INTRAMUSCULAR | Status: DC | PRN

## 2020-07-22 MED ORDER — METOPROLOL SUCCINATE ER 100 MG PO TB24
100.0000 mg | ORAL_TABLET | Freq: Every day | ORAL | Status: DC
  Administered 2020-07-22 – 2020-07-23 (×2): 100 mg via ORAL
  Filled 2020-07-22 (×2): qty 1

## 2020-07-22 MED ORDER — BUPIVACAINE HCL (PF) 0.25 % IJ SOLN
INTRAMUSCULAR | Status: AC
  Filled 2020-07-22: qty 60

## 2020-07-22 MED ORDER — MIDAZOLAM HCL 2 MG/2ML IJ SOLN
INTRAMUSCULAR | Status: DC | PRN
  Administered 2020-07-22 (×2): 1 mg via INTRAVENOUS

## 2020-07-22 MED ORDER — SODIUM CHLORIDE 0.9% FLUSH: 3.0000 mL | INTRAVENOUS | Status: DC | PRN

## 2020-07-22 MED ORDER — HEPARIN (PORCINE) IN NACL 1000-0.9 UT/500ML-% IV SOLN
INTRAVENOUS | Status: DC | PRN
  Administered 2020-07-22 (×3): 500 mL

## 2020-07-22 MED ORDER — ACETAMINOPHEN 325 MG PO TABS
650.0000 mg | ORAL_TABLET | ORAL | Status: DC | PRN
  Filled 2020-07-22: qty 2

## 2020-07-22 MED ORDER — ACETAMINOPHEN 325 MG PO TABS
650.0000 mg | ORAL_TABLET | ORAL | Status: DC | PRN
  Administered 2020-07-22 – 2020-07-23 (×2): 650 mg via ORAL
  Filled 2020-07-22 (×2): qty 2

## 2020-07-22 MED ORDER — PROTAMINE SULFATE 10 MG/ML IV SOLN
INTRAVENOUS | Status: DC | PRN
  Administered 2020-07-22: 30 mg via INTRAVENOUS

## 2020-07-22 MED ORDER — METOPROLOL TARTRATE 5 MG/5ML IV SOLN
INTRAVENOUS | Status: DC | PRN
  Administered 2020-07-22: 5 mg via INTRAVENOUS

## 2020-07-22 MED ORDER — METOPROLOL TARTRATE 5 MG/5ML IV SOLN
INTRAVENOUS | Status: AC
  Filled 2020-07-22: qty 5

## 2020-07-22 MED ORDER — PHENYLEPHRINE HCL-NACL 10-0.9 MG/250ML-% IV SOLN
INTRAVENOUS | Status: DC | PRN
  Administered 2020-07-22: 15 ug/min via INTRAVENOUS

## 2020-07-22 MED ORDER — ISOPROTERENOL HCL 0.2 MG/ML IJ SOLN
INTRAMUSCULAR | Status: AC
  Filled 2020-07-22: qty 5

## 2020-07-22 SURGICAL SUPPLY — 21 items
BAG SNAP BAND KOVER 36X36 (MISCELLANEOUS) ×2 IMPLANT
BLANKET WARM UNDERBOD FULL ACC (MISCELLANEOUS) ×2 IMPLANT
CATH 8FR REPROCESSED SOUNDSTAR (CATHETERS) ×2 IMPLANT
CATH JOSEPH QUAD ALLRED 6F REP (CATHETERS) ×2 IMPLANT
CATH MAPPNG PENTARAY F 2-6-2MM (CATHETERS) ×1 IMPLANT
CATH SMTCH THERMOCOOL SF FJ (CATHETERS) ×2 IMPLANT
CLOSURE PERCLOSE PROSTYLE (VASCULAR PRODUCTS) ×8 IMPLANT
PACK EP LATEX FREE (CUSTOM PROCEDURE TRAY) ×1
PACK EP LF (CUSTOM PROCEDURE TRAY) ×1 IMPLANT
PAD PRO RADIOLUCENT 2001M-C (PAD) ×2 IMPLANT
PATCH CARTO3 (PAD) ×2 IMPLANT
PENTARAY F 2-6-2MM (CATHETERS) ×2
SHEATH BRITE TIP 8FR 35CM (SHEATH) ×2 IMPLANT
SHEATH CARTO VIZIGO SM CVD (SHEATH) ×2 IMPLANT
SHEATH PINNACLE 6F 10CM (SHEATH) ×2 IMPLANT
SHEATH PINNACLE 7F 10CM (SHEATH) ×2 IMPLANT
SHEATH PINNACLE 8F 10CM (SHEATH) ×2 IMPLANT
SHEATH PINNACLE 9F 10CM (SHEATH) ×2 IMPLANT
SHEATH PROBE COVER 6X72 (BAG) ×2 IMPLANT
TUBING SMART ABLATE COOLFLOW (TUBING) ×2 IMPLANT
WIRE HI TORQ VERSACORE-J 145CM (WIRE) ×4 IMPLANT

## 2020-07-22 NOTE — Anesthesia Procedure Notes (Signed)
Procedure Name: MAC Date/Time: 07/22/2020 10:36 AM Performed by: Barrington Ellison, CRNA Pre-anesthesia Checklist: Patient identified, Emergency Drugs available, Suction available, Patient being monitored and Timeout performed Patient Re-evaluated:Patient Re-evaluated prior to induction Oxygen Delivery Method: Simple face mask Placement Confirmation: positive ETCO2 and CO2 detector

## 2020-07-22 NOTE — Anesthesia Postprocedure Evaluation (Signed)
Anesthesia Post Note  Patient: Kyle Kim  Procedure(s) Performed: PVC ABLATION     Patient location during evaluation: PACU Anesthesia Type: MAC Level of consciousness: awake and alert and oriented Pain management: pain level controlled Vital Signs Assessment: post-procedure vital signs reviewed and stable Respiratory status: spontaneous breathing, nonlabored ventilation and respiratory function stable Cardiovascular status: stable and blood pressure returned to baseline Postop Assessment: no apparent nausea or vomiting Anesthetic complications: no   No notable events documented.  Last Vitals:  Vitals:   07/22/20 1215 07/22/20 1220  BP: 135/87 (!) 129/94  Pulse: 70 68  Resp: 16 13  Temp:  36.6 C  SpO2: 96% 96%    Last Pain:  Vitals:   07/22/20 1220  TempSrc: Temporal  PainSc: 3                  Morganne Haile A.

## 2020-07-22 NOTE — Discharge Summary (Addendum)
ELECTROPHYSIOLOGY PROCEDURE DISCHARGE SUMMARY    Patient ID: Kyle Kim,  MRN: [id number], DOB/AGE: 1960/06/08 60 y.o.  Admit date: 07/22/2020 Discharge date: 07/23/2020  Primary Care Physician: Maryland Pink, MD  Primary Cardiologist: Ida Rogue, MD  Electrophysiologist: Dr. Quentin Ore  Primary Discharge Diagnosis:  Ventricular Tachycardia/PVCs  Procedures This Admission:  1.  Electrophysiology study and radiofrequency catheter ablation of  Ventricular Tachycardia/PVCs  on 07/22/2020 by  Dr. Quentin Ore .   This study demonstrated: i. Sinus rhythm with very frequent PVCs upon presentation   ii. Inducible ventricular tachycardia/frequent PVCs (monomorphic, steeply inferior axis with a precordial transition in V2, QS in lead I) iii.  Ablation at the left/right commissure and right ventricular outflow tract without acute success iv. No early apparent complications. v.  Plan to initiate flecainide 100 mg by mouth twice daily in addition to his metoprolol succinate given no evidence of coronary artery disease or scar on cardiac MRI.  Plan to see back in clinic in 7 to 10 days for repeat EKG on flecainide and metoprolol.  Brief HPI: Kyle Kim is a 60 y.o. male with a history of frequent monomorphic PVCs and NSVT.  They have failed medical therapy with beta blocker. Risks, benefits, and alternatives to catheter ablation of  PVCs/Ventricular Tachycardia  were reviewed with the patient who wished to proceed.    Hospital Course:  The patient was admitted and underwent EPS/RFCA of  PVCs/VT  with details as outlined above. He was started on flecainide with inability to inhibit PVCs with ablation as above.  They were monitored on telemetry overnight which demonstrated NSR with marked improvement in PVC burden, whether post ablation effect or on flecainide.  Groin was without complication on the day of discharge.  The patient was examined and considered to be stable for discharge.  Wound  care and restrictions were reviewed with the patient.  The patient will be seen back in 7-10 days for EKG on flecainide.    Physical Exam: Vitals:   07/23/20 0300 07/23/20 0433 07/23/20 0448 07/23/20 0800  BP:  122/77 116/80 115/74  Pulse:  67 86 67  Resp: 19 17 17    Temp:  97.8 F (36.6 C) (!) 97.4 F (36.3 C) 98 F (36.7 C)  TempSrc:  Oral Oral Oral  SpO2: 93% 95% 100% 93%  Weight:  97.2 kg    Height:        GEN- The patient is well appearing, alert and oriented x 3 today.   HEENT: normocephalic, atraumatic; sclera clear, conjunctiva pink; hearing intact; oropharynx clear; neck supple  Lungs- Clear to ausculation bilaterally, normal work of breathing.  No wheezes, rales, rhonchi Heart- Regular rate and rhythm, no murmurs, rubs or gallops  GI- soft, non-tender, non-distended, bowel sounds present  Extremities- no clubbing, cyanosis, or edema; DP/PT/radial pulses 2+ bilaterally, groin without hematoma/bruit MS- no significant deformity or atrophy Skin- warm and dry, no rash or lesion Psych- euthymic mood, full affect Neuro- strength and sensation are intact   Labs:   Lab Results  Component Value Date   WBC 7.1 07/17/2020   HGB 14.7 07/17/2020   HCT 42.5 07/17/2020   MCV 91.0 07/17/2020   PLT 290 07/17/2020    Recent Labs  Lab 07/23/20 0604  NA 135  K 3.8  CL 105  CO2 24  BUN <5*  CREATININE 0.74  CALCIUM 8.5*  GLUCOSE 112*     Discharge Medications:  Allergies as of 07/23/2020       Reactions  Pecan Nut (diagnostic) Shortness Of Breath   All tree nuts cause throat swelling and "asthma-like" reaction   Quinolones Other (See Comments)   Fluoroquinolones not recommended in patients with aortic root dilation        Medication List     TAKE these medications    acetaminophen 500 MG tablet Commonly known as: TYLENOL Take 1,000 mg by mouth every 6 (six) hours as needed for moderate pain or mild pain. Every three days   albuterol 108 (90 Base)  MCG/ACT inhaler Commonly known as: VENTOLIN HFA Inhale 2 puffs into the lungs every 6 (six) hours as needed for wheezing or shortness of breath.   aspirin 325 MG tablet Take 650 mg by mouth every 6 (six) hours as needed for moderate pain or mild pain (Back Pain). Every three days   cyclobenzaprine 5 MG tablet Commonly known as: FLEXERIL Take 5 mg by mouth 3 (three) times daily as needed (Back Spasm).   flecainide 100 MG tablet Commonly known as: TAMBOCOR Take 1 tablet (100 mg total) by mouth every 12 (twelve) hours.   fluticasone 50 MCG/ACT nasal spray Commonly known as: FLONASE Place 2 sprays into both nostrils 2 (two) times daily.   ibuprofen 200 MG tablet Commonly known as: ADVIL Take 400 mg by mouth every 8 (eight) hours as needed for moderate pain or mild pain (Back pain).   metoprolol succinate 100 MG 24 hr tablet Commonly known as: Toprol XL Take 1 tablet (100 mg total) by mouth daily. Take with or immediately following a meal.   montelukast 10 MG tablet Commonly known as: SINGULAIR Take 10 mg by mouth daily.   sildenafil 50 MG tablet Commonly known as: VIAGRA Take 50 mg by mouth as needed for erectile dysfunction.        Disposition:    Follow-up Information     Maryland Pink, MD Follow up.   Specialty: Family Medicine Why: The office will call the patient. Contact information: [place] [address] [phone]         Minna Merritts, MD .   Specialty: Cardiology Contact information: [address] [place] [address] [phone]         [facility] Follow up.   Specialty: Cardiology Why: on 7/20 at 330 for RN visit and EKG post flecainide start Contact information: [address] [place] [phone]                Duration of Discharge Encounter: Greater than 30 minutes including physician  time.  Jacalyn Lefevre, PA-C  07/23/2020 8:58 AM

## 2020-07-22 NOTE — H&P (Signed)
Virtual Visit via Telephone Note    This visit type was conducted due to national recommendations for restrictions regarding the COVID-19 Pandemic (e.g. social distancing) in an effort to limit this patient's exposure and mitigate transmission in our community.  Due to his co-morbid illnesses, this patient is at least at moderate risk for complications without adequate follow up.  This format is felt to be most appropriate for this patient at this time.  The patient did not have access to video technology/had technical difficulties with video requiring transitioning to audio format only (telephone).  All issues noted in this document were discussed and addressed.  No physical exam could be performed with this format.  Please refer to the patient's chart for his  consent to telehealth for [facility]      Date:  06/19/2020    ID:  Kyle Kim, DOB 1960-02-14, MRN [id number] The patient was identified using 2 identifiers.   Patient Location: Home Provider Location: Home Office     PCP:  Maryland Pink, MD              [facility] Providers Cardiologist:  Ida Rogue, MD      Evaluation Performed:  Follow-Up Visit   Chief Complaint: PVCs   History of Present Illness:     Kyle Kim is a 60 y.o. male with PVCs who presents for follow-up.  I met the patient on Jun 11, 2020 in the emergency department when he presented with nonsustained ventricular tachycardia and very frequent PVCs.  The patient has no evidence of structural heart disease.  He has had a left heart catheterization which showed no significant coronary artery disease.  A cardiac MRI showed no fibrosis or inflammation.  The dimensions of his LV and RV are normal.  His PVCs are monomorphic.  The patient does have symptoms of palpitations that occur when he exerts himself.  He is a very active man and is previously cycled for exercise.  He has held off on cycling since the diagnosis.  No syncope or presyncope.  No  ICD history in his family.   We increase his metoprolol succinate to 50 mg by mouth twice daily.  He tells me that since the increased dose of his metoprolol, he does think the symptoms have improved.  He did tell me that this morning he walked briskly back from the mailbox and noted some palpitations.  No syncope or presyncope since leaving the hospital.             Past Medical History:  Diagnosis Date   Arthritis      lower back   Asthma     Skin cancer 04/13/2016    left neck   Wears contact lenses           Past Surgical History:  Procedure Laterality Date   COLONOSCOPY       ETHMOIDECTOMY Bilateral 11/10/2016    Procedure: ETHMOIDECTOMY;  Surgeon: Clyde Canterbury, MD;  Location: [place];  Service: ENT;  Laterality: Bilateral;   FRONTAL SINUS EXPLORATION Bilateral 11/10/2016    Procedure: FRONTAL SINUS EXPLORATION;  Surgeon: Clyde Canterbury, MD;  Location: [place];  Service: ENT;  Laterality: Bilateral;   IMAGE GUIDED SINUS SURGERY N/A 11/10/2016    Procedure: IMAGE GUIDED SINUS SURGERY;  Surgeon: Clyde Canterbury, MD;  Location: [place];  Service: ENT;  Laterality: N/A;  GAVE DISK TO CE[facility] 10-23   LEFT HEART CATH AND CORONARY ANGIOGRAPHY N/A 05/30/2020  Procedure: LEFT HEART CATH AND CORONARY ANGIOGRAPHY;  Surgeon: Minna Merritts, MD;  Location: [place] CV [facility];  Service: Cardiovascular;  Laterality: N/A;   MAXILLARY ANTROSTOMY Bilateral 11/10/2016    Procedure: MAXILLARY ANTROSTOMY WITH TISSUE REMOVAL;  Surgeon: Clyde Canterbury, MD;  Location: [place];  Service: ENT;  Laterality: Bilateral;   SPHENOIDECTOMY Bilateral 11/10/2016    Procedure: Coralee Pesa;  Surgeon: Clyde Canterbury, MD;  Location: [place];  Service: ENT;  Laterality: Bilateral;      Active Medications      Current Meds  Medication Sig   acetaminophen (TYLENOL) 500 MG tablet Take 1,000 mg by mouth every 6 (six) hours as needed for moderate pain or  mild pain.   albuterol (PROVENTIL HFA;VENTOLIN HFA) 108 (90 Base) MCG/ACT inhaler Inhale 2 puffs into the lungs every 6 (six) hours as needed for wheezing or shortness of breath.   aspirin 325 MG tablet Take 650 mg by mouth every 6 (six) hours as needed for moderate pain or mild pain.   cyclobenzaprine (FLEXERIL) 5 MG tablet Take 5 mg by mouth 3 (three) times daily as needed for muscle spasms.   fluticasone (FLONASE) 50 MCG/ACT nasal spray Place 2 sprays into both nostrils 2 (two) times daily.   ibuprofen (ADVIL) 200 MG tablet Take 400 mg by mouth every 8 (eight) hours as needed for moderate pain or mild pain.   metoprolol succinate (TOPROL XL) 100 MG 24 hr tablet Take 1 tablet (100 mg total) by mouth daily. Take with or immediately following a meal.   montelukast (SINGULAIR) 10 MG tablet Take 10 mg by mouth daily.   sildenafil (VIAGRA) 50 MG tablet Take 50 mg by mouth as needed for erectile dysfunction.        Allergies:   Pecan nut (diagnostic) and Quinolones    Social History         Tobacco Use   Smoking status: Former Smoker      Types: Cigarettes      Quit date: 2008      Years since quitting: 14.4   Smokeless tobacco: Never Used  Scientific laboratory technician Use: Never used  Substance Use Topics   Alcohol use: Yes      Alcohol/week: 14.0 standard drinks      Types: 14 Cans of beer per week      Comment: 1-2 beers per days 5-6 days per week   Drug use: Never      Family Hx: The patient's family history includes Diabetes in his mother.   ROS:   Please see the history of present illness.      All other systems reviewed and are negative.     Prior CV studies:   The following studies were reviewed today:   Jun 11, 2020 cardiac MRI IMPRESSION: 1. Normal LV size and systolic function (EF 46%), though cardiac motion artifact from frequent PVCs can affect quantification of volumes   2.  Normal RV size and systolic function (EF 80%)   3.  No late gadolinium enhancement to  suggest myocardial scar   4. Normal global and regional T2 values suggests no myocardial Inflammation     May 30, 2020 left heart catheterization Coronary dominance: Left dominant  Left mainstem:   Large vessel that bifurcates into the LAD and left circumflex, no significant disease noted  Left anterior descending (LAD):   Large vessel that extends to the apical region, diagonal branch 2 of moderate size, no significant disease noted  Left circumflex (LCx):  Large vessel with OM branch 2, no significant disease noted  Right coronary artery (RCA):  Right nondominant vessel with PL and PDA, no significant disease noted  Left ventriculography: no significant aortic valve stenosis       April 03, 2020 echo personally reviewed Left ventricular function normal, 55% Right ventricular function normal Mild MR Mild dilation of the aortic root, 39 mm   Jun 11, 2020 EKG Monomorphic PVCs with a steeply inferior axis, precordial transition preceding sinus rhythm precordial transition    Labs/Other Tests and Data Reviewed:        Recent Labs: 06/11/2020: BUN 11; Creatinine, Ser 0.90; Hemoglobin 14.8; Magnesium 2.0; Platelets 283; Potassium 4.4; Sodium 136; TSH 2.945    Recent Lipid Panel Labs (Brief)  No results found for: CHOL, TRIG, HDL, CHOLHDL, LDLCALC, LDLDIRECT        Wt Readings from Last 3 Encounters:  06/11/20 215 lb (97.5 kg)  05/30/20 215 lb (97.5 kg)  05/02/20 215 lb (97.5 kg)        Objective:    Vital Signs:  There were no vitals taken for this visit.      ASSESSMENT & PLAN:     Frequent monomorphic PVCs and NSVT Structurally normal heart.  No fibrosis or inflammation on cardiac MRI.  Very high burden of monomorphic PVCs originating from the outflow tracts.  Incomplete control on an acceptable dose beta-blocker.  I would recommend that we pursue EP study and ablation for possible definitive management of his PVCs.  I discussed the EP study and ablation  procedure in detail with the patient during today's visit including the risks, efficacy and expected recovery time.  I discussed the possibility that we can localize the PVC but not ablate given adjacent structures.  I discussed the possibility of needing antiarrhythmic therapy in the future.  Five days prior to the procedure he would decrease his metoprolol to 25 mg once a day for 2 days and then stop his metoprolol altogether for 3 days prior to the procedure.   Will likely require mapping of the left ventricular outflow tract as well as the right ventricular outflow tract.   I would also like to refer him to genetics given the very high burden of PVCs, substantial burden of NSVT and link to exertion.  Would like to make sure that were not catching a inherited cardiomyopathy prior to its manifestation on cardiac imaging.   Given the patient's structurally normal heart without evidence of fibrosis on cardiac MRI and the hemodynamic stability during salvos of NSVT, I do not think an ICD is indicated.   Therapeutic strategies for PVC/VT including medicine and ablation were discussed in detail with the patient today. Risk, benefits, and alternatives to EP study and radiofrequency ablation were also discussed in detail today. These risks include but are not limited to stroke, bleeding, vascular damage, tamponade, perforation, damage to the heart and other structures, AV block requiring pacemaker, worsening renal function, and death. The patient understands these risk and wishes to proceed.  We will therefore proceed with catheter ablation at the next available time.       Time:   Today, I have spent 25 minutes with the patient with telehealth technology discussing the above problems.       Medication Adjustments/Labs and Tests Ordered: Current medicines are reviewed at length with the patient today.  Concerns regarding medicines are outlined above.   Tests Ordered: No orders of the defined types  were  placed in this encounter.     Medication Changes: No orders of the defined types were placed in this encounter.     Signed, Vickie Epley, MD  06/19/2020 10:04 AM    [place]     -------------------------------------------------------------------------  I have seen, examined the patient, and reviewed the above assessment and plan.    Plan for EP study and PVC ablation.  Vickie Epley, MD 07/22/2020 7:12 AM

## 2020-07-22 NOTE — Transfer of Care (Signed)
Immediate Anesthesia Transfer of Care Note  Patient: Kyle Kim  Procedure(s) Performed: PVC ABLATION  Patient Location: Cath Lab  Anesthesia Type:MAC  Level of Consciousness: drowsy and patient cooperative  Airway & Oxygen Therapy: Patient Spontanous Breathing and Patient connected to face mask oxygen  Post-op Assessment: Report given to RN  Post vital signs: Reviewed and stable  Last Vitals:  Vitals Value Taken Time  BP 117/73 07/22/20 1130  Temp    Pulse 37 07/22/20 1133  Resp 10 07/22/20 1133  SpO2 96 % 07/22/20 1133  Vitals shown include unvalidated device data.  Last Pain:  Vitals:   07/22/20 0658  TempSrc: Oral  PainSc: 0-No pain         Complications: No notable events documented.

## 2020-07-22 NOTE — Plan of Care (Signed)

## 2020-07-23 ENCOUNTER — Encounter (HOSPITAL_COMMUNITY): Payer: Self-pay | Admitting: Cardiology

## 2020-07-23 DIAGNOSIS — I493 Ventricular premature depolarization: Secondary | ICD-10-CM | POA: Diagnosis not present

## 2020-07-23 DIAGNOSIS — Z20822 Contact with and (suspected) exposure to covid-19: Secondary | ICD-10-CM | POA: Diagnosis not present

## 2020-07-23 DIAGNOSIS — I472 Ventricular tachycardia: Secondary | ICD-10-CM | POA: Diagnosis not present

## 2020-07-23 DIAGNOSIS — Z79899 Other long term (current) drug therapy: Secondary | ICD-10-CM | POA: Diagnosis not present

## 2020-07-23 LAB — BASIC METABOLIC PANEL
Anion gap: 6 (ref 5–15)
BUN: <5 mg/dL — ABNORMAL LOW (ref 6–20)
CO2: 24 mmol/L (ref 22–32)
Calcium: 8.5 mg/dL — ABNORMAL LOW (ref 8.9–10.3)
Chloride: 105 mmol/L (ref 98–111)
Creatinine, Ser: 0.74 mg/dL (ref 0.61–1.24)
GFR, Estimated: >60 mL/min (ref >60)
Glucose, Bld: 112 mg/dL — ABNORMAL HIGH (ref 70–99)
Potassium: 3.8 mmol/L (ref 3.5–5.1)
Sodium: 135 mmol/L (ref 135–145)

## 2020-07-23 LAB — POCT ACTIVATED CLOTTING TIME
Activated Clotting Time: 237 seconds
Activated Clotting Time: 271 seconds
Activated Clotting Time: 265 seconds
Activated Clotting Time: 294 seconds
Activated Clotting Time: 300 seconds

## 2020-07-23 MED ORDER — FLECAINIDE ACETATE 100 MG PO TABS: 100.0000 mg | ORAL_TABLET | Freq: Two times a day (BID) | ORAL | 6 refills | Status: DC

## 2020-07-23 MED FILL — Bupivacaine HCl Preservative Free (PF) Inj 0.25%: INTRAMUSCULAR | Qty: 30 | Status: AC

## 2020-07-23 NOTE — Plan of Care (Signed)

## 2020-07-23 NOTE — Discharge Instructions (Signed)

## 2020-07-31 ENCOUNTER — Other Ambulatory Visit: Payer: Self-pay

## 2020-07-31 ENCOUNTER — Ambulatory Visit (INDEPENDENT_AMBULATORY_CARE_PROVIDER_SITE_OTHER): Payer: No Typology Code available for payment source

## 2020-07-31 VITALS — BP 128/74 | HR 64 | Ht 70.0 in | Wt 224.0 lb

## 2020-07-31 DIAGNOSIS — Z5181 Encounter for therapeutic drug level monitoring: Secondary | ICD-10-CM | POA: Diagnosis not present

## 2020-07-31 DIAGNOSIS — Z79899 Other long term (current) drug therapy: Secondary | ICD-10-CM

## 2020-07-31 NOTE — Progress Notes (Signed)
Reason for visit: Patient started on flecainide therapy  Name of MD requesting visit: Dr. Quentin Ore  H&P: Patient reports that he is doing well. He is tolerating his current medication regimen. No complaint of side effects or symptoms.  ROS related to problem: EKG performed. Stable compared to his EKG done prior to starting flecainide therapy.  Assessment and plan per MD: EKG reviewed by Dr. Quentin Ore. Per Dr. Quentin Ore no changes need at this time. Patient is to follow up as planned in the next week weeks.

## 2020-08-01 ENCOUNTER — Ambulatory Visit: Payer: No Typology Code available for payment source

## 2020-08-20 ENCOUNTER — Encounter: Payer: Self-pay | Admitting: Cardiology

## 2020-08-20 ENCOUNTER — Ambulatory Visit (INDEPENDENT_AMBULATORY_CARE_PROVIDER_SITE_OTHER): Payer: No Typology Code available for payment source | Admitting: Cardiology

## 2020-08-20 ENCOUNTER — Ambulatory Visit: Payer: No Typology Code available for payment source | Admitting: Genetic Counselor

## 2020-08-20 ENCOUNTER — Other Ambulatory Visit: Payer: Self-pay

## 2020-08-20 VITALS — BP 102/60 | HR 74 | Ht 70.0 in | Wt 213.0 lb

## 2020-08-20 DIAGNOSIS — I472 Ventricular tachycardia, unspecified: Secondary | ICD-10-CM

## 2020-08-20 DIAGNOSIS — I493 Ventricular premature depolarization: Secondary | ICD-10-CM | POA: Diagnosis not present

## 2020-08-20 MED ORDER — PROPAFENONE HCL 225 MG PO TABS: 225.0000 mg | ORAL_TABLET | Freq: Two times a day (BID) | ORAL | 11 refills | Status: DC

## 2020-08-20 NOTE — Patient Instructions (Addendum)
Medication Instructions:  Your physician has recommended you make the following change in your medication:    STOP taking flecainide  2.    START taking propafenone 225 mg-  Take one tablet by mouth twice a day  *If you need a refill on your cardiac medications before your next appointment, please call your pharmacy*  Lab Work: None ordered. If you have labs (blood work) drawn today and your tests are completely normal, you will receive your results only by: [place] (if you have MyChart) OR A paper copy in the mail If you have any lab test that is abnormal or we need to change your treatment, we will call you to review the results.  Testing/Procedures: You will be scheduled for an exercise treadmill test in 1-2 weeks as fits into your work schedule.  Follow-Up: At [facility], you and your health needs are our priority.  As part of our continuing mission to provide you with exceptional heart care, we have created designated Provider Care Teams.  These Care Teams include your primary Cardiologist (physician) and Advanced Practice Providers (APPs -  Physician Assistants and Nurse Practitioners) who all work together to provide you with the care you need, when you need it.  Your next appointment:   Your physician wants you to follow-up in: 6-8 weeks with Vickie Epley, MD in [place].

## 2020-08-20 NOTE — Progress Notes (Signed)
Electrophysiology Office Follow up Visit Note:    Date:  08/20/2020   ID:  Kyle Kim, DOB 09-18-1960, MRN [id number]  PCP:  Maryland Pink, MD  [facility] Cardiologist:  Ida Rogue, MD  [facility] Electrophysiologist:  Vickie Epley, MD    Interval History:    Kyle Kim is a 60 y.o. male who presents for a follow up visit after a PVC ablation on 07/22/2020. During the procedure, the PVC was localized to the left/right commissure.  Ablation was performed there and the right ventricular outflow tract.  Despite ablation in both of these areas there was no acute success but shortly after the procedure was completed the PVC did go away.  Despite that delayed success, I started flecainide 100 mg by mouth twice daily.  He returned for an EKG on July 31, 2020.   Today he tells me that for a few weeks after the ablation procedure the PVCs went away completely.  The last couple weeks they have returned.  Despite the PVCs returning they seem to be less frequent.  He has restarted exercising and has not had any sustained episodes of tachyarrhythmias which he previously experienced.  No syncope or presyncope.  He is overall tolerated the flecainide well.    Past Medical History:  Diagnosis Date   Arthritis    lower back   Asthma    Skin cancer 04/13/2016   left neck   Wears contact lenses     Past Surgical History:  Procedure Laterality Date   COLONOSCOPY     ETHMOIDECTOMY Bilateral 11/10/2016   Procedure: ETHMOIDECTOMY;  Surgeon: Clyde Canterbury, MD;  Location: [place];  Service: ENT;  Laterality: Bilateral;   FRONTAL SINUS EXPLORATION Bilateral 11/10/2016   Procedure: FRONTAL SINUS EXPLORATION;  Surgeon: Clyde Canterbury, MD;  Location: [place];  Service: ENT;  Laterality: Bilateral;   IMAGE GUIDED SINUS SURGERY N/A 11/10/2016   Procedure: IMAGE GUIDED SINUS SURGERY;  Surgeon: Clyde Canterbury, MD;  Location: [place];  Service: ENT;   Laterality: N/A;  GAVE DISK TO CE[facility] 10-23   LEFT HEART CATH AND CORONARY ANGIOGRAPHY N/A 05/30/2020   Procedure: LEFT HEART CATH AND CORONARY ANGIOGRAPHY;  Surgeon: Minna Merritts, MD;  Location: [place] CV LAB;  Service: Cardiovascular;  Laterality: N/A;   MAXILLARY ANTROSTOMY Bilateral 11/10/2016   Procedure: MAXILLARY ANTROSTOMY WITH TISSUE REMOVAL;  Surgeon: Clyde Canterbury, MD;  Location: [place];  Service: ENT;  Laterality: Bilateral;   PVC ABLATION N/A 07/22/2020   Procedure: PVC ABLATION;  Surgeon: Vickie Epley, MD;  Location: [place] CV [facility];  Service: Cardiovascular;  Laterality: N/A;   SPHENOIDECTOMY Bilateral 11/10/2016   Procedure: SPHENOIDECTOMY;  Surgeon: Clyde Canterbury, MD;  Location: [place];  Service: ENT;  Laterality: Bilateral;    Current Medications: Current Meds  Medication Sig   acetaminophen (TYLENOL) 500 MG tablet Take 1,000 mg by mouth every 6 (six) hours as needed for moderate pain or mild pain. Every three days   albuterol (PROVENTIL HFA;VENTOLIN HFA) 108 (90 Base) MCG/ACT inhaler Inhale 2 puffs into the lungs every 6 (six) hours as needed for wheezing or shortness of breath.   aspirin 325 MG tablet Take 650 mg by mouth every 6 (six) hours as needed for moderate pain or mild pain (Back Pain). Every three days   cyclobenzaprine (FLEXERIL) 5 MG tablet Take 5 mg by mouth 3 (three) times daily as needed (Back Spasm).   fluticasone (FLONASE) 50 MCG/ACT nasal spray  Place 2 sprays into both nostrils 2 (two) times daily.   ibuprofen (ADVIL) 200 MG tablet Take 400 mg by mouth every 8 (eight) hours as needed for moderate pain or mild pain (Back pain).   metoprolol succinate (TOPROL XL) 100 MG 24 hr tablet Take 1 tablet (100 mg total) by mouth daily. Take with or immediately following a meal.   montelukast (SINGULAIR) 10 MG tablet Take 10 mg by mouth daily.   propafenone (RYTHMOL) 225 MG tablet Take 1 tablet (225 mg total) by mouth in the  morning and at bedtime.   sildenafil (VIAGRA) 50 MG tablet Take 50 mg by mouth as needed for erectile dysfunction.   [DISCONTINUED] flecainide (TAMBOCOR) 100 MG tablet Take 1 tablet (100 mg total) by mouth every 12 (twelve) hours.     Allergies:   Black walnut flavor, Chocolate hazelnut flavor, Cholestatin, Pecan nut (diagnostic), and Quinolones   Social History   Socioeconomic History   Marital status: Married    Spouse name: Not on file   Number of children: Not on file   Years of education: Not on file   Highest education level: Not on file  Occupational History   Not on file  Tobacco Use   Smoking status: Former    Types: Cigarettes    Quit date: 2008    Years since quitting: 14.6   Smokeless tobacco: Never  Vaping Use   Vaping Use: Never used  Substance and Sexual Activity   Alcohol use: Yes    Alcohol/week: 14.0 standard drinks    Types: 14 Cans of beer per week    Comment: 1-2 beers per days 5-6 days per week   Drug use: Never   Sexual activity: Not on file  Other Topics Concern   Not on file  Social History Narrative   Not on file   Social Determinants of Health   Financial Resource Strain: Not on file  Food Insecurity: Not on file  Transportation Needs: Not on file  Physical Activity: Not on file  Stress: Not on file  Social Connections: Not on file     Family History: The patient's family history includes Diabetes in his mother.  ROS:   Please see the history of present illness.    All other systems reviewed and are negative.  EKGs/Labs/Other Studies Reviewed:    The following studies were reviewed today:  July 31, 2020 EKG personally reviewed Sinus rhythm PR interval slightly above 200 ms which is stable from previous EKGs prior to flecainide initiation.   EKG:  The ekg ordered today demonstrates sinus rhythm with frequent monomorphic PVCs.  PVCs have a steeply inferior axis and a precordial transition in V3 which precedes the sinus  transition.  The PVC is wide with a QRS duration of approximately 200 ms.  Recent Labs: 06/11/2020: Magnesium 2.0; TSH 2.945 07/17/2020: Hemoglobin 14.7; Platelets 290 07/23/2020: BUN <5; Creatinine, Ser 0.74; Potassium 3.8; Sodium 135  Recent Lipid Panel No results found for: CHOL, TRIG, HDL, CHOLHDL, VLDL, LDLCALC, LDLDIRECT  Physical Exam:    VS:  BP 102/60   Pulse 74   Ht '5\' 10"'$  (1.778 m)   Wt 213 lb (96.6 kg)   SpO2 97%   BMI 30.56 kg/m     Wt Readings from Last 3 Encounters:  08/20/20 213 lb (96.6 kg)  07/31/20 224 lb (101.6 kg)  07/23/20 214 lb 4.8 oz (97.2 kg)     GEN:  Well nourished, well developed in no acute distress HEENT:  Normal NECK: No JVD; No carotid bruits LYMPHATICS: No lymphadenopathy CARDIAC: Irregular rhythm, no murmurs, rubs, gallops RESPIRATORY:  Clear to auscultation without rales, wheezing or rhonchi  ABDOMEN: Soft, non-tender, non-distended MUSCULOSKELETAL:  No edema; No deformity  SKIN: Warm and dry NEUROLOGIC:  Alert and oriented x 3 PSYCHIATRIC:  Normal affect   ASSESSMENT:    1. Ventricular tachycardia ([place])   2. PVC's (premature ventricular contractions)   3. Aortic root dilatation ([facility])    PLAN:    In order of problems listed above:   1. Ventricular tachycardia ([place]) 2. PVC's (premature ventricular contractions)  The patient's sustained VT has resolved but he continues to have a high burden of symptomatic PVCs.  The PVCs are monomorphic and appear to be originating from the left ventricular outflow tract.  During the previous EP study with ablation this area was ablated extensively from above the aortic valve and from the right ventricular outflow tract without successfully modifying PVC frequency.  Flecainide has been partially successful in lowering the burden of PVCs but the patient is still having a very high burden.  I would like to stop the flecainide today.  I will start propafenone 225 mg by mouth twice a day.  He will continue  to take metoprolol succinate.  I would like to see him back for an exercise treadmill test in 1 to 2 weeks.  I would like to see him back in clinic in 6 to 8 weeks to assess response to therapy.  In the future, if he is not getting a good enough response with antiarrhythmic drug therapy, could consider an alternative antiarrhythmic drug such as amiodarone versus repeat ablation attempt.   Total time spent with patient today 35 minutes. This includes reviewing records, evaluating the patient and coordinating care.   Medication Adjustments/Labs and Tests Ordered: Current medicines are reviewed at length with the patient today.  Concerns regarding medicines are outlined above.  Orders Placed This Encounter  Procedures   Exercise Tolerance Test   EKG 12-Lead   Meds ordered this encounter  Medications   propafenone (RYTHMOL) 225 MG tablet    Sig: Take 1 tablet (225 mg total) by mouth in the morning and at bedtime.    Dispense:  60 tablet    Refill:  11     Signed, Lars Mage, MD, [facility], [facility] 08/20/2020 6:12 PM    Electrophysiology [facility]

## 2020-08-28 NOTE — Progress Notes (Signed)
Referring Provider: Lars Mage, MD  Referral Reason Kyle Kim was referred for genetic consult and testing subsequent to episodes of NSVTs and PVCs with a suspicion of an inherited cardiomyopathy.  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 He states that he felt as if his heart was a skipping a beath. Also reports having asthma. In high school he joined the school swim and track team and reports no limitations in these activities. Around 2020, he began experiencing noticeable irregular heartbeats, especially at rest, as well as momentary light-headedness that would come in waves. The last 2 years, there has been progressive worsening of his symptoms including increased shortness of breath upon exertion, heart palpitations and light headedness. Since he had not seen a doctor in 3 years, he registered as a new patient at [facility] in February of this year. He tells me that he was found to have an irregular heart beat and was eventually referred to Dr. Quentin Ore. Cardiac imaging studies  revealed normal coronaries, LVEF of 55% and further EP workup is recommended.     Family history Haydon (III.1) has a healthy and active 46 y.o. son (IV.1). He has two younger sisters, ages 21 and 55 (III.2, III.3). While his youngest sister is in good health, he does report heart palpitations for the last 2 years in his middle sister that he thinks is stress-related. No history of heart disease amongst his nephews and niece (IV.2-IV.6).   Father (II.4) died of cancer at age 86. He has one living brother, age 69 (II.1) and two deceased- one from old age at 65 (II.2) and the other died suddenly in his 44s (II.3). This paternal uncle was a Production manager who died while playing at a tournament. An autopsy was not performed to confirm cause of death. He is not in touch with his paternal  cousins and is not aware of their current health status.  Paternal grandfather (I.1) died around age 17 from cancer and grandmother (I.2) died suddenly in her 61s. He says that she was in good health with no prior heart issues at the time of her death.  Coner's mother (II.5) is now 52 and in good cardiac health. Two sisters are deceased- one in her 30s from cancer (II.6) and the other at 9 (II.7) from generally poor health. He reports that his maternal uncle died in his sleep at age 34 and that the family suspected that his death was heart -related. An autopsy was not performed. He is not in touch with his cousins from his maternal lineage. Maternal grandfather (I.3) died of colon cancer at age 39 and grandmother (I.4) died at 28 from natural causes.  Pre-Test Genetic Consultation Notes  Maveryk was counseled on the genetics of rhythm disorders, also called channelopathies that include Brugada syndrome (BrS), Long QT syndrome (LQTS), and Catecholaminergic Polymorphic Ventricular Tachycardia (CPVT) as well as cardiomyopathies such as HCM, ARVC and DCM. We discussed inheritance, incomplete penetrance and variable expression associated with these conditions.   We walked through the process of genetic testing and discussed the potential outcomes of genetic testing. I explained to him that there are three possible outcomes of genetic testing; namely positive, negative and variant of unknown significance. A positive outcome can be expected in patients that do not have risk factors for channelopathies, present early in life with increased severity and have a family history of sudden cardiac death  and/or a relative that has been diagnosed with a channelopathy. A negative test may indicate that this is an idiopathic condition. Variants of unknown significance (VUS) can be obtained. I explained to him that typically a VUS is so classified if the variant is not well understood as very few individuals have been  reported to harbor this variant or its role in gene function has not been elucidated. I then reviewed genetic testing of first-degree family members and subsequent follow-up of those at risk of inheriting this condition.   Impression  In summary, Shubham presents with NSVT and PVCs at age 70 in the absence of risk factors. Additionally, there is a dramatic family history of sudden death in both his maternal and paternal relatives.  It is reasonable to proceed with genetic testing for channelopathies and cardiomyopathies to identify the molecular event that is creating the cardiac substrate for his NSVTs and PVCs.  Genetic testing for the genes implicated in channelopathies and cardiomyopathies is recommended. This test should include the major genes that contribute to LQTS, BrS, and CPVT as well as HCM, ARVC and DCM. The genetic test will help confirm his diagnosis and identify the genetic basis of his disease. Since this is an autosomal dominant condition, a positive test result will help identify first-degree family members (child, sibling and parent) that may harbor the mutation and are at risk of developing this condition. Appropriate cardiology follow-up and lifestyle management can then be directed to those genotype-positive family members.   In addition, we discussed the protections afforded by the Genetic Information Non-Discrimination Act (GINA). I explained to him that GINA protects him from losing employment or health insurance based on his genotype. However, these protections do not cover life insurance and disability. He verbalized understanding of this and tells me that his son has life insurance.  Please note that the patient has not been counseled in this visit on personal, cultural or ethical issues that he may face due to his heart condition.   Plan After a thorough discussion of the risk and benefits of genetic testing for channelopathies and cardiomyopathies, Clarnce states his intent  to pursue genetic testing and signed the informed consent form. Blood was drawn today for testing.   Lattie Corns, Ph.D, [facility] Clinical Molecular Geneticist

## 2020-09-05 ENCOUNTER — Other Ambulatory Visit: Payer: Self-pay

## 2020-09-05 ENCOUNTER — Ambulatory Visit (INDEPENDENT_AMBULATORY_CARE_PROVIDER_SITE_OTHER): Payer: No Typology Code available for payment source

## 2020-09-05 DIAGNOSIS — I493 Ventricular premature depolarization: Secondary | ICD-10-CM

## 2020-09-05 LAB — EXERCISE TOLERANCE TEST
Angina Index: 0
Base ST Depression (mm): 0 mm
Duke Treadmill Score: 7
Estimated workload: 8.7
Exercise duration (min): 7 min
Exercise duration (sec): 9 s
MPHR: 163 {beats}/min
Peak HR: 114 {beats}/min
Percent HR: 69 %
RPE: 17
Rest HR: 80 {beats}/min
ST Depression (mm): 0 mm

## 2020-10-01 NOTE — Progress Notes (Signed)
Electrophysiology Office Follow up Visit Note:    Date:  10/02/2020   ID:  Kyle Kim, DOB 31-Oct-1960, MRN [id number]  PCP:  Maryland Pink, MD  [facility] Cardiologist:  Ida Rogue, MD  [facility] Electrophysiologist:  Vickie Epley, MD    Interval History:    Kyle Kim is a 60 y.o. male who presents for a follow up visit. They were last seen in clinic 08/20/2020 for PVC. He underwent an initially successful PVC ablation but unfortunately the PVC returned. He was initially managed with Flecainide but this was not successful in suppressing the PVC so it was changed to Propafenone.   Kyle Kim tells me that in general he is doing much better.  He is getting back to exercise with no sustained episodes of symptomatic PVCs or VT.  He thinks the overall burden is significantly improved but he obviously would like it to be better.  He is tolerating the propafenone.     Past Medical History:  Diagnosis Date   Arthritis    lower back   Asthma    Skin cancer 04/13/2016   left neck   Wears contact lenses     Past Surgical History:  Procedure Laterality Date   COLONOSCOPY     ETHMOIDECTOMY Bilateral 11/10/2016   Procedure: ETHMOIDECTOMY;  Surgeon: Clyde Canterbury, MD;  Location: [place];  Service: ENT;  Laterality: Bilateral;   FRONTAL SINUS EXPLORATION Bilateral 11/10/2016   Procedure: FRONTAL SINUS EXPLORATION;  Surgeon: Clyde Canterbury, MD;  Location: [place];  Service: ENT;  Laterality: Bilateral;   IMAGE GUIDED SINUS SURGERY N/A 11/10/2016   Procedure: IMAGE GUIDED SINUS SURGERY;  Surgeon: Clyde Canterbury, MD;  Location: [place];  Service: ENT;  Laterality: N/A;  GAVE DISK TO [facility] 10-23   LEFT HEART CATH AND CORONARY ANGIOGRAPHY N/A 05/30/2020   Procedure: LEFT HEART CATH AND CORONARY ANGIOGRAPHY;  Surgeon: Minna Merritts, MD;  Location: [place] [facility];  Service: Cardiovascular;  Laterality: N/A;   MAXILLARY ANTROSTOMY  Bilateral 11/10/2016   Procedure: MAXILLARY ANTROSTOMY WITH TISSUE REMOVAL;  Surgeon: Clyde Canterbury, MD;  Location: [place];  Service: ENT;  Laterality: Bilateral;   PVC ABLATION N/A 07/22/2020   Procedure: PVC ABLATION;  Surgeon: Vickie Epley, MD;  Location: [place] [facility];  Service: Cardiovascular;  Laterality: N/A;   SPHENOIDECTOMY Bilateral 11/10/2016   Procedure: SPHENOIDECTOMY;  Surgeon: Clyde Canterbury, MD;  Location: [place];  Service: ENT;  Laterality: Bilateral;    Current Medications: Current Meds  Medication Sig   acetaminophen (TYLENOL) 500 MG tablet Take 1,000 mg by mouth every 6 (six) hours as needed for moderate pain or mild pain. Every three days   albuterol (PROVENTIL HFA;VENTOLIN HFA) 108 (90 Base) MCG/ACT inhaler Inhale 2 puffs into the lungs every 6 (six) hours as needed for wheezing or shortness of breath.   aspirin 325 MG tablet Take 650 mg by mouth every 6 (six) hours as needed for moderate pain or mild pain (Back Pain). Every three days   cyclobenzaprine (FLEXERIL) 5 MG tablet Take 5 mg by mouth 3 (three) times daily as needed (Back Spasm).   ibuprofen (ADVIL) 200 MG tablet Take 400 mg by mouth every 8 (eight) hours as needed for moderate pain or mild pain (Back pain).   metoprolol succinate (TOPROL XL) 100 MG 24 hr tablet Take 1 tablet (100 mg total) by mouth daily. Take with or immediately following a meal.   montelukast (SINGULAIR) 10  MG tablet Take 10 mg by mouth daily.   propafenone (RYTHMOL) 225 MG tablet Take 1 tablet (225 mg total) by mouth in the morning and at bedtime.   sildenafil (VIAGRA) 50 MG tablet Take 50 mg by mouth as needed for erectile dysfunction.   XHANCE 93 MCG/ACT EXHU SMARTSIG:2 Puff(s) Both Nares Twice Daily     Allergies:   Black walnut flavor, Chocolate hazelnut flavor, Cholestatin, Pecan nut (diagnostic), and Quinolones   Social History   Socioeconomic History   Marital status: Married    Spouse name:  Not on file   Number of children: Not on file   Years of education: Not on file   Highest education level: Not on file  Occupational History   Not on file  Tobacco Use   Smoking status: Former    Types: Cigarettes    Quit date: 2008    Years since quitting: 14.7   Smokeless tobacco: Never  Vaping Use   Vaping Use: Never used  Substance and Sexual Activity   Alcohol use: Yes    Alcohol/week: 14.0 standard drinks    Types: 14 Cans of beer per week    Comment: 1-2 beers per days 5-6 days per week   Drug use: Never   Sexual activity: Not on file  Other Topics Concern   Not on file  Social History Narrative   Not on file   Social Determinants of Health   Financial Resource Strain: Not on file  Food Insecurity: Not on file  Transportation Needs: Not on file  Physical Activity: Not on file  Stress: Not on file  Social Connections: Not on file     Family History: The patient's family history includes Diabetes in his mother.  ROS:   Please see the history of present illness.    All other systems reviewed and are negative.  EKGs/Labs/Other Studies Reviewed:    The following studies were reviewed today:  09/05/2020 ETT personally reviewed Inconclusive ECG stress test for ischemia due to insufficient heart rate response. Drug-induced chronotropic incompetence (beta blocker, propafenone). Frequent RVOT PVCs/couplets/NSVT at baseline, resolve with activity and return in late recovery. No evidence for class I antiarrhythmic-related QRS broadening or pro-arrhythmia with exercise.        EKG:  The ekg ordered today demonstrates sinus rhythm with a PR interval of 230 ms.  QRS duration is 106 ms.  QTc is 416 ms.  No PVCs on today's EKG or rhythm strip.  Recent Labs: 06/11/2020: Magnesium 2.0; TSH 2.945 07/17/2020: Hemoglobin 14.7; Platelets 290 07/23/2020: BUN <5; Creatinine, Ser 0.74; Potassium 3.8; Sodium 135  Recent Lipid Panel No results found for: CHOL, TRIG, HDL,  CHOLHDL, VLDL, LDLCALC, LDLDIRECT  Physical Exam:    VS:  BP 110/62 (BP Location: Left Arm, Patient Position: Sitting, Cuff Size: Normal)   Pulse 62   Ht 5\' 10"  (1.778 m)   Wt 209 lb (94.8 kg)   SpO2 96%   BMI 29.99 kg/m     Wt Readings from Last 3 Encounters:  10/02/20 209 lb (94.8 kg)  08/20/20 213 lb (96.6 kg)  07/31/20 224 lb (101.6 kg)     GEN:  Well nourished, well developed in no acute distress HEENT: Normal NECK: No JVD; No carotid bruits LYMPHATICS: No lymphadenopathy CARDIAC: RRR, no murmurs, rubs, gallops RESPIRATORY:  Clear to auscultation without rales, wheezing or rhonchi  ABDOMEN: Soft, non-tender, non-distended MUSCULOSKELETAL:  No edema; No deformity  SKIN: Warm and dry NEUROLOGIC:  Alert and oriented x  3 PSYCHIATRIC:  Normal affect   ASSESSMENT:    1. PVC's (premature ventricular contractions)   2. Ventricular tachycardia ([place])   3. Encounter for long-term (current) use of high-risk medication    PLAN:    In order of problems listed above:    #PVCs Improved on propafenone.  EKG shows he is tolerating the propafenone.  ETT confirms as well.  He still having some but is unclear the overall burden.  I will place a 3-day ZIO monitor to quantify.  We discussed the residual options available including alternative antiarrhythmic drug (sotalol versus amiodarone) versus repeat ablation attempt.  We will plan to touch base in 6 months or sooner if his symptoms worsen or change or if the ZIO monitor shows a significant increase in PVC burden.  I have encouraged him to continue exercising.   Follow-up 6 months.    Medication Adjustments/Labs and Tests Ordered: Current medicines are reviewed at length with the patient today.  Concerns regarding medicines are outlined above.  Orders Placed This Encounter  Procedures   LONG TERM MONITOR (3-14 DAYS)   EKG 12-Lead   No orders of the defined types were placed in this encounter.    Signed, Lars Mage, MD, [facility], [facility] 10/02/2020 10:17 AM    Electrophysiology [facility]

## 2020-10-02 ENCOUNTER — Other Ambulatory Visit: Payer: Self-pay

## 2020-10-02 ENCOUNTER — Encounter: Payer: Self-pay | Admitting: Cardiology

## 2020-10-02 ENCOUNTER — Ambulatory Visit (INDEPENDENT_AMBULATORY_CARE_PROVIDER_SITE_OTHER): Payer: No Typology Code available for payment source

## 2020-10-02 ENCOUNTER — Ambulatory Visit (INDEPENDENT_AMBULATORY_CARE_PROVIDER_SITE_OTHER): Payer: No Typology Code available for payment source | Admitting: Cardiology

## 2020-10-02 VITALS — BP 110/62 | HR 62 | Ht 70.0 in | Wt 209.0 lb

## 2020-10-02 DIAGNOSIS — I493 Ventricular premature depolarization: Secondary | ICD-10-CM

## 2020-10-02 DIAGNOSIS — Z79899 Other long term (current) drug therapy: Secondary | ICD-10-CM | POA: Diagnosis not present

## 2020-10-02 DIAGNOSIS — I472 Ventricular tachycardia, unspecified: Secondary | ICD-10-CM

## 2020-10-02 NOTE — Patient Instructions (Addendum)
Medication Instructions:  Your physician recommends that you continue on your current medications as directed. Please refer to the Current Medication list given to you today. *If you need a refill on your cardiac medications before your next appointment, please call your pharmacy*  Lab Work: None ordered. If you have labs (blood work) drawn today and your tests are completely normal, you will receive your results only by: [place] (if you have MyChart) OR A paper copy in the mail If you have any lab test that is abnormal or we need to change your treatment, we will call you to review the results.  Testing/Procedures: Your physician has recommended that you wear a holter monitor. Holter monitors are medical devices that record the heart's electrical activity. Doctors most often use these monitors to diagnose arrhythmias.   You will wear a 3 day ZIO monitor  Follow-Up: At [facility], you and your health needs are our priority.  As part of our continuing mission to provide you with exceptional heart care, we have created designated Provider Care Teams.  These Care Teams include your primary Cardiologist (physician) and Advanced Practice Providers (APPs -  Physician Assistants and Nurse Practitioners) who all work together to provide you with the care you need, when you need it.  Your next appointment:   Your physician wants you to follow-up in: 6 months with Dr. Quentin Ore or sooner based on results of your heart monitor.  Your physician has recommended that you wear a Zio monitor.   This monitor is a medical device that records the heart's electrical activity. Doctors most often use these monitors to diagnose arrhythmias. Arrhythmias are problems with the speed or rhythm of the heartbeat. The monitor is a small device applied to your chest. You can wear one while you do your normal daily activities. While wearing this monitor if you have any symptoms to push the button and record what  you felt. Once you have worn this monitor for the period of time provider prescribed (Usually 14 days), you will return the monitor device in the postage paid box. Once it is returned they will download the data collected and provide [place] with a report which the provider will then review and we will call you with those results. Important tips:  Avoid showering during the first 24 hours of wearing the monitor. Avoid excessive sweating to help maximize wear time. Do not submerge the device, no hot tubs, and no swimming pools. Keep any lotions or oils away from the patch. After 24 hours you may shower with the patch on. Take brief showers with your back facing the shower head.  Do not remove patch once it has been placed because that will interrupt data and decrease adhesive wear time. Push the button when you have any symptoms and write down what you were feeling. Once you have completed wearing your monitor, remove and place into box which has postage paid and place in your outgoing mailbox.  If for some reason you have misplaced your box then call our office and we can provide another box and/or mail it off for you.

## 2020-10-10 ENCOUNTER — Telehealth: Payer: Self-pay | Admitting: Cardiology

## 2020-10-10 NOTE — Telephone Encounter (Signed)
ZIO with [employer] calling with critical results  Transferred to [place]

## 2020-10-10 NOTE — Telephone Encounter (Signed)
Called received from ZIO to advise monitor had been completed and read.  Pt with episode called VT for 30 seconds at HR for 149 BMP.  Advised ordering physician that monitor complete and ready for review.

## 2020-10-10 NOTE — Telephone Encounter (Signed)
Left message requesting call back.  Also sent MyChart message.

## 2020-10-15 NOTE — Telephone Encounter (Signed)
Call placed to Pt.  Advised to STOP flecainide and rhythmol per Dr. Quentin Ore.  Per Pt he thought he did better on the flecainide.  Reiterated again to stop flecainide.  Follow up scheduled with Dr. Quentin Ore for 10/16/20.

## 2020-10-15 NOTE — Progress Notes (Signed)
Virtual Visit via Telephone Note   This visit type was conducted due to national recommendations for restrictions regarding the COVID-19 Pandemic (e.g. social distancing) in an effort to limit this patient's exposure and mitigate transmission in our community.  Due to his co-morbid illnesses, this patient is at least at moderate risk for complications without adequate follow up.  This format is felt to be most appropriate for this patient at this time.  The patient did not have access to video technology/had technical difficulties with video requiring transitioning to audio format only (telephone).  All issues noted in this document were discussed and addressed.  No physical exam could be performed with this format.  Please refer to the patient's chart for his  consent to telehealth for [facility].    Date:  10/16/2020   ID:  Kyle Kim, DOB March 08, 1960, MRN [id number] The patient was identified using 2 identifiers.  Patient Location: Home Provider Location: Office/Clinic   PCP:  Maryland Pink, MD   [place] [facility] Cardiologist:  Ida Rogue, MD Electrophysiologist:  Vickie Epley, MD {   Evaluation Performed:  Follow-Up Visit  Chief Complaint:  PVCs  History of Present Illness:    Kyle Kim is a 60 y.o. male with PVCs who presents for follow up after a recent heart monitor.  He is followed for PVCs.  He had a prior PVC ablation on July 22, 2020.  This was not a successful ablation.  The earliest area was ablated within the cusps and outflow tract without an impact related on the PVC.  I suspect that his PVC originates from the epicardial surface.  He has previously tried flecainide and propafenone.  Each of these have not been successful.  He tells me he continues to feel like his heart is skipping lots of beats.  No syncope or presyncope.  He is currently not taking flecainide or propafenone.       Past Medical History:  Diagnosis Date    Arthritis    lower back   Asthma    Skin cancer 04/13/2016   left neck   Wears contact lenses    Past Surgical History:  Procedure Laterality Date   COLONOSCOPY     ETHMOIDECTOMY Bilateral 11/10/2016   Procedure: ETHMOIDECTOMY;  Surgeon: Clyde Canterbury, MD;  Location: [place];  Service: ENT;  Laterality: Bilateral;   FRONTAL SINUS EXPLORATION Bilateral 11/10/2016   Procedure: FRONTAL SINUS EXPLORATION;  Surgeon: Clyde Canterbury, MD;  Location: [place];  Service: ENT;  Laterality: Bilateral;   IMAGE GUIDED SINUS SURGERY N/A 11/10/2016   Procedure: IMAGE GUIDED SINUS SURGERY;  Surgeon: Clyde Canterbury, MD;  Location: [place];  Service: ENT;  Laterality: N/A;  GAVE DISK TO [facility] 10-23   LEFT HEART CATH AND CORONARY ANGIOGRAPHY N/A 05/30/2020   Procedure: LEFT HEART CATH AND CORONARY ANGIOGRAPHY;  Surgeon: Minna Merritts, MD;  Location: [place] [facility];  Service: Cardiovascular;  Laterality: N/A;   MAXILLARY ANTROSTOMY Bilateral 11/10/2016   Procedure: MAXILLARY ANTROSTOMY WITH TISSUE REMOVAL;  Surgeon: Clyde Canterbury, MD;  Location: [place];  Service: ENT;  Laterality: Bilateral;   PVC ABLATION N/A 07/22/2020   Procedure: PVC ABLATION;  Surgeon: Vickie Epley, MD;  Location: [place] [facility];  Service: Cardiovascular;  Laterality: N/A;   SPHENOIDECTOMY Bilateral 11/10/2016   Procedure: SPHENOIDECTOMY;  Surgeon: Clyde Canterbury, MD;  Location: [place];  Service: ENT;  Laterality: Bilateral;     No outpatient medications have  been marked as taking for the 10/16/20 encounter (Office Visit) with Vickie Epley, MD.     Allergies:   Black walnut flavor, Chocolate hazelnut flavor, Cholestatin, Pecan nut (diagnostic), and Quinolones   Social History   Tobacco Use   Smoking status: Former    Types: Cigarettes    Quit date: 2008    Years since quitting: 14.7   Smokeless tobacco: Never  Vaping Use   Vaping Use: Never used   Substance Use Topics   Alcohol use: Yes    Alcohol/week: 14.0 standard drinks    Types: 14 Cans of beer per week    Comment: 1-2 beers per days 5-6 days per week   Drug use: Never     Family Hx: The patient's family history includes Diabetes in his mother.  ROS:   Please see the history of present illness.     All other systems reviewed and are negative.   Prior CV studies:   The following studies were reviewed today:  October 10, 2020 ZIO personally reviewed HR 42 - 203 bpm. 1259 VT episodes, longest lasting 13minute at a rate of 145bpm. Patient triggered episodes correspond to VT episode. Rare supraventricular ectopy. Will schedule follow up appointment to discuss.   Labs/Other Tests and Data Reviewed:    EKG:  No ECG reviewed.  Recent Labs: 06/11/2020: Magnesium 2.0; TSH 2.945 07/17/2020: Hemoglobin 14.7; Platelets 290 07/23/2020: BUN <5; Creatinine, Ser 0.74; Potassium 3.8; Sodium 135   Recent Lipid Panel No results found for: CHOL, TRIG, HDL, CHOLHDL, LDLCALC, LDLDIRECT  Wt Readings from Last 3 Encounters:  10/02/20 209 lb (94.8 kg)  08/20/20 213 lb (96.6 kg)  07/31/20 224 lb (101.6 kg)         Objective:    Vital Signs:  There were no vitals taken for this visit.   VITAL SIGNS:  reviewed PSYCH:  normal affect  ASSESSMENT & PLAN:    PVCs Symptomatic.  Very high burden.  Outflow tract origin.  I suspect it is epicardial.  I would like to try a different antiarrhythmic drug before considering another ablation attempt.  We discussed using sotalol.  We discussed the need for hospitalization and the risks associated with sotalol use.  He wants to chat with his family about it and then will let us know if you would like to proceed.  We also discussed amiodarone during today's appointment.  We discussed the long-term risks associated with amiodarone and the need for frequent monitoring for liver and thyroid and lung toxicities.  Time:   Today, I have spent 15  minutes with the patient with telehealth technology discussing the above problems.     Medication Adjustments/Labs and Tests Ordered: Current medicines are reviewed at length with the patient today.  Concerns regarding medicines are outlined above.   Tests Ordered: No orders of the defined types were placed in this encounter.   Medication Changes: No orders of the defined types were placed in this encounter.    Signed, Vickie Epley, MD  10/16/2020 8:09 AM    [place]

## 2020-10-16 ENCOUNTER — Ambulatory Visit: Payer: No Typology Code available for payment source | Admitting: Cardiology

## 2020-10-16 ENCOUNTER — Other Ambulatory Visit: Payer: Self-pay

## 2020-10-16 ENCOUNTER — Ambulatory Visit (INDEPENDENT_AMBULATORY_CARE_PROVIDER_SITE_OTHER): Payer: No Typology Code available for payment source | Admitting: Cardiology

## 2020-10-16 DIAGNOSIS — I472 Ventricular tachycardia, unspecified: Secondary | ICD-10-CM | POA: Diagnosis not present

## 2020-10-16 DIAGNOSIS — I493 Ventricular premature depolarization: Secondary | ICD-10-CM

## 2020-10-16 NOTE — Patient Instructions (Signed)
Medication Instructions:  Your physician recommends that you continue on your current medications as directed. Please refer to the Current Medication list given to you today. *If you need a refill on your cardiac medications before your next appointment, please call your pharmacy*  Lab Work: None ordered. If you have labs (blood work) drawn today and your tests are completely normal, you will receive your results only by: [place] (if you have MyChart) OR A paper copy in the mail If you have any lab test that is abnormal or we need to change your treatment, we will call you to review the results.  Testing/Procedures: None ordered.  Follow-Up: At [facility], you and your health needs are our priority.  As part of our continuing mission to provide you with exceptional heart care, we have created designated Provider Care Teams.  These Care Teams include your primary Cardiologist (physician) and Advanced Practice Providers (APPs -  Physician Assistants and Nurse Practitioners) who all work together to provide you with the care you need, when you need it.  Your next appointment:    Please contact me when you decide what arrhythmic you would like to start.  Sonia Baller RN

## 2020-10-31 ENCOUNTER — Telehealth: Payer: Self-pay | Admitting: Cardiology

## 2020-10-31 DIAGNOSIS — Z0279 Encounter for issue of other medical certificate: Secondary | ICD-10-CM

## 2020-10-31 NOTE — Telephone Encounter (Signed)
Received FMLA for [employer], put forms in nurse box

## 2020-11-12 NOTE — Telephone Encounter (Signed)
Lmov to discuss completion with patient.

## 2020-11-22 ENCOUNTER — Telehealth: Payer: Self-pay | Admitting: Cardiology

## 2020-11-22 NOTE — Telephone Encounter (Signed)
Patient calling to discuss POC for sotalol admission.

## 2020-11-26 NOTE — Telephone Encounter (Signed)
Patient came by office  Dropped off FMLA forms that we completed with some highlighted areas they would like [place] to reevaluate Placed in nurse box

## 2020-11-27 NOTE — Telephone Encounter (Signed)
Called patient to let him know forms complete faxed and mailed to him .

## 2020-12-09 ENCOUNTER — Inpatient Hospital Stay (HOSPITAL_COMMUNITY)
Admission: RE | Admit: 2020-12-09 | Discharge: 2020-12-12 | DRG: 310 | Disposition: A | Discharge status: Home / Self Care | Payer: No Typology Code available for payment source | Source: Ambulatory Visit | Attending: Cardiology | Admitting: Cardiology

## 2020-12-09 DIAGNOSIS — Z91018 Allergy to other foods: Secondary | ICD-10-CM

## 2020-12-09 DIAGNOSIS — Z888 Allergy status to other drugs, medicaments and biological substances status: Secondary | ICD-10-CM | POA: Diagnosis not present

## 2020-12-09 DIAGNOSIS — Z23 Encounter for immunization: Secondary | ICD-10-CM | POA: Diagnosis not present

## 2020-12-09 DIAGNOSIS — Z85828 Personal history of other malignant neoplasm of skin: Secondary | ICD-10-CM | POA: Diagnosis not present

## 2020-12-09 DIAGNOSIS — J45909 Unspecified asthma, uncomplicated: Secondary | ICD-10-CM | POA: Diagnosis present

## 2020-12-09 DIAGNOSIS — Z833 Family history of diabetes mellitus: Secondary | ICD-10-CM

## 2020-12-09 DIAGNOSIS — Z79899 Other long term (current) drug therapy: Secondary | ICD-10-CM

## 2020-12-09 DIAGNOSIS — I493 Ventricular premature depolarization: Secondary | ICD-10-CM | POA: Diagnosis present

## 2020-12-09 DIAGNOSIS — I472 Ventricular tachycardia, unspecified: Secondary | ICD-10-CM | POA: Diagnosis not present

## 2020-12-09 LAB — BASIC METABOLIC PANEL
Anion gap: 7 (ref 5–15)
BUN: 14 mg/dL (ref 6–20)
CO2: 23 mmol/L (ref 22–32)
Calcium: 8.6 mg/dL — ABNORMAL LOW (ref 8.9–10.3)
Chloride: 108 mmol/L (ref 98–111)
Creatinine, Ser: 0.8 mg/dL (ref 0.61–1.24)
GFR, Estimated: >60 mL/min (ref >60)
Glucose, Bld: 99 mg/dL (ref 70–99)
Potassium: 3.8 mmol/L (ref 3.5–5.1)
Sodium: 138 mmol/L (ref 135–145)

## 2020-12-09 LAB — MAGNESIUM: Magnesium: 2 mg/dL (ref 1.7–2.4)

## 2020-12-09 MED ORDER — POTASSIUM CHLORIDE CRYS ER 20 MEQ PO TBCR
40.0000 meq | EXTENDED_RELEASE_TABLET | Freq: Once | ORAL | Status: AC
  Administered 2020-12-09: 18:00:00 40 meq via ORAL
  Filled 2020-12-09: qty 2

## 2020-12-09 MED ORDER — SODIUM CHLORIDE 0.9 % IV SOLN: 250.0000 mL | INTRAVENOUS | Status: DC | PRN

## 2020-12-09 MED ORDER — MONTELUKAST SODIUM 10 MG PO TABS
10.0000 mg | ORAL_TABLET | Freq: Every day | ORAL | Status: DC
  Administered 2020-12-09 – 2020-12-12 (×4): 10 mg via ORAL
  Filled 2020-12-09 (×4): qty 1

## 2020-12-09 MED ORDER — MAGNESIUM SULFATE 2 GM/50ML IV SOLN
2.0000 g | Freq: Once | INTRAVENOUS | Status: AC
  Administered 2020-12-09: 18:00:00 2 g via INTRAVENOUS
  Filled 2020-12-09: qty 50

## 2020-12-09 MED ORDER — ALBUTEROL SULFATE (2.5 MG/3ML) 0.083% IN NEBU: 3.0000 mL | INHALATION_SOLUTION | Freq: Four times a day (QID) | RESPIRATORY_TRACT | Status: DC | PRN

## 2020-12-09 MED ORDER — INFLUENZA VAC SPLIT QUAD 0.5 ML IM SUSY
0.5000 mL | PREFILLED_SYRINGE | INTRAMUSCULAR | Status: AC | PRN
  Administered 2020-12-12: 0.5 mL via INTRAMUSCULAR

## 2020-12-09 MED ORDER — ACETAMINOPHEN 500 MG PO TABS: 1000.0000 mg | ORAL_TABLET | Freq: Four times a day (QID) | ORAL | Status: DC | PRN

## 2020-12-09 MED ORDER — CYCLOBENZAPRINE HCL 10 MG PO TABS: 5.0000 mg | ORAL_TABLET | Freq: Three times a day (TID) | ORAL | Status: DC | PRN

## 2020-12-09 MED ORDER — SODIUM CHLORIDE 0.9% FLUSH: 3.0000 mL | INTRAVENOUS | Status: DC | PRN

## 2020-12-09 MED ORDER — SOTALOL HCL 80 MG PO TABS
80.0000 mg | ORAL_TABLET | Freq: Two times a day (BID) | ORAL | Status: DC
  Administered 2020-12-09: 20:00:00 80 mg via ORAL
  Filled 2020-12-09 (×2): qty 1

## 2020-12-09 MED ORDER — METOPROLOL SUCCINATE ER 100 MG PO TB24
100.0000 mg | ORAL_TABLET | Freq: Every day | ORAL | Status: DC
  Filled 2020-12-09: qty 1

## 2020-12-09 MED ORDER — SODIUM CHLORIDE 0.9% FLUSH
3.0000 mL | Freq: Two times a day (BID) | INTRAVENOUS | Status: DC
  Administered 2020-12-09 – 2020-12-12 (×4): 3 mL via INTRAVENOUS

## 2020-12-09 NOTE — Progress Notes (Signed)
Pharmacy Consult for Sotalol Electrolyte Replacement  Pharmacy consulted to assist in monitoring and replacing electrolytes in this 60 y.o. male admitted on 12/09/2020 undergoing sotalol initiation . First sotalol dose: 80 mg  Labs:    Component Value Date/Time   K 3.8 07/23/2020 0604   MG 2.0 06/11/2020 0825     Plan: Potassium: K 3.8-3.9:  Hold Sotalol initiation and give KCl 40 mEq po x1 then begin Sotalol at least 2hr after KCl dose - do not need to recheck K   Magnesium: Mg 1.8-2: Give Mg 2 gm IV x1 to prevent Mg from dropping below 1.8 - do not need to recheck Mg. Appropriate to initiate Sotalol   Thank you for allowing pharmacy to participate in this patient's care   Corinda Gubler 12/09/2020  5:28 PM

## 2020-12-09 NOTE — Progress Notes (Signed)
Pt presented for sotalol admission.  Labs and baseline EKG pending.  Full note pending.  Legrand Como [address] [place], [place]

## 2020-12-09 NOTE — H&P (Signed)
ELECTROPHYSIOLOGY H&P NOTE    Patient ID: Kyle Kim MRN: [id number], DOB/AGE: Oct 03, 1960 60 y.o.  Admit date: 12/09/2020 Date of Consult: 12/09/2020  Primary Physician: [place] Pink, MD Primary Cardiologist: Ida Rogue, MD  Electrophysiologist: Dr. Quentin Ore  Reason for Admission: Sotalol Loading   Patient Profile: Kyle Kim is a 60 y.o. 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   HPI:  Kyle Kim is a 60 y.o. male with medical history as above.   He had a prior PVC ablation on July 22, 2020.  This was not a successful ablation.  The earliest area was ablated within the cusps and outflow tract without an impact related on the PVC.  We suspect that his PVC originates from the epicardial surface.  He has previously failed flecainide and propafenone  He presents today for a planned admission to load Sotalol. We have previously discussed the risks and benefits of said admission. He continues to feel very frequent palpitations. No syncope or presyncope.   EKG and labs pending.   Past Medical History:  Diagnosis Date   Arthritis    lower back   Asthma    Skin cancer 04/13/2016   left neck   Wears contact lenses      Surgical History:  Past Surgical History:  Procedure Laterality Date   COLONOSCOPY     ETHMOIDECTOMY Bilateral 11/10/2016   Procedure: ETHMOIDECTOMY;  Surgeon: Clyde Canterbury, MD;  Location: [place];  Service: ENT;  Laterality: Bilateral;   FRONTAL SINUS EXPLORATION Bilateral 11/10/2016   Procedure: FRONTAL SINUS EXPLORATION;  Surgeon: Clyde Canterbury, MD;  Location: [place];  Service: ENT;  Laterality: Bilateral;   IMAGE GUIDED SINUS SURGERY N/A 11/10/2016   Procedure: IMAGE GUIDED SINUS SURGERY;  Surgeon: Clyde Canterbury, MD;  Location: [place];  Service: ENT;  Laterality: N/A;  GAVE DISK TO [facility] 10-23   LEFT HEART CATH  AND CORONARY ANGIOGRAPHY N/A 05/30/2020   Procedure: LEFT HEART CATH AND CORONARY ANGIOGRAPHY;  Surgeon: Minna Merritts, MD;  Location: [place] [facility];  Service: Cardiovascular;  Laterality: N/A;   MAXILLARY ANTROSTOMY Bilateral 11/10/2016   Procedure: MAXILLARY ANTROSTOMY WITH TISSUE REMOVAL;  Surgeon: Clyde Canterbury, MD;  Location: [place];  Service: ENT;  Laterality: Bilateral;   PVC ABLATION N/A 07/22/2020   Procedure: PVC ABLATION;  Surgeon: Vickie Epley, MD;  Location: [place] [facility];  Service: Cardiovascular;  Laterality: N/A;   SPHENOIDECTOMY Bilateral 11/10/2016   Procedure: SPHENOIDECTOMY;  Surgeon: Clyde Canterbury, MD;  Location: [place];  Service: ENT;  Laterality: Bilateral;     Medications Prior to Admission  Medication Sig Dispense Refill Last Dose   acetaminophen (TYLENOL) 500 MG tablet Take 1,000 mg by mouth every 6 (six) hours as needed for moderate pain or mild pain. Every three days      albuterol (PROVENTIL HFA;VENTOLIN HFA) 108 (90 Base) MCG/ACT inhaler Inhale 2 puffs into the lungs every 6 (six) hours as needed for wheezing or shortness of breath.      aspirin 325 MG tablet Take 650 mg by mouth every 6 (six) hours as needed for moderate pain or mild pain (Back Pain). Every three days      cyclobenzaprine (FLEXERIL) 5 MG tablet Take 5 mg by mouth 3 (three) times daily as needed (Back Spasm).      fluticasone (FLONASE) 50 MCG/ACT nasal spray Place 2 sprays  into both nostrils 2 (two) times daily. (Patient not taking: Reported on 10/02/2020)      ibuprofen (ADVIL) 200 MG tablet Take 400 mg by mouth every 8 (eight) hours as needed for moderate pain or mild pain (Back pain).      metoprolol succinate (TOPROL XL) 100 MG 24 hr tablet Take 1 tablet (100 mg total) by mouth daily. Take with or immediately following a meal. 30 tablet 11    montelukast (SINGULAIR) 10 MG tablet Take 10 mg by mouth daily.      sildenafil (VIAGRA) 50 MG tablet Take 50  mg by mouth as needed for erectile dysfunction.      XHANCE 93 MCG/ACT EXHU SMARTSIG:2 Puff(s) Both Nares Twice Daily       Inpatient Medications:   sodium chloride flush  3 mL Intravenous Q12H   sotalol  80 mg Oral Q12H    Allergies:  Allergies  Allergen Reactions   Psychologist, clinical Shortness Of Breath and Swelling   Chocolate Hazelnut Flavor Shortness Of Breath and Swelling   Cholestatin Shortness Of Breath and Swelling   Pecan Nut (Diagnostic) Shortness Of Breath    All tree nuts cause throat swelling and "asthma-like" reaction   Quinolones Other (See Comments)    Fluoroquinolones not recommended in patients with aortic root dilation    Social History   Socioeconomic History   Marital status: Married    Spouse name: Not on file   Number of children: Not on file   Years of education: Not on file   Highest education level: Not on file  Occupational History   Not on file  Tobacco Use   Smoking status: Former    Types: Cigarettes    Quit date: 2008    Years since quitting: 14.9   Smokeless tobacco: Never  Vaping Use   Vaping Use: Never used  Substance and Sexual Activity   Alcohol use: Yes    Alcohol/week: 14.0 standard drinks    Types: 14 Cans of beer per week    Comment: 1-2 beers per days 5-6 days per week   Drug use: Never   Sexual activity: Not on file  Other Topics Concern   Not on file  Social History Narrative   Not on file   Social Determinants of Health   Financial Resource Strain: Not on file  Food Insecurity: Not on file  Transportation Needs: Not on file  Physical Activity: Not on file  Stress: Not on file  Social Connections: Not on file  Intimate Partner Violence: Not on file     Family History  Problem Relation Age of Onset   Diabetes Mother      Review of Systems: All other systems reviewed and are otherwise negative except as noted above.  Physical Exam: Vitals:   12/09/20 1616  BP: (!) 133/91  Pulse: 63  Temp: 98.1 F  (36.7 C)  TempSrc: Oral  SpO2: 97%  Weight: 94.3 kg  Height: 5\' 9"  (1.753 m)    GEN- The patient is well appearing, alert and oriented x 3 today.   HEENT: normocephalic, atraumatic; sclera clear, conjunctiva pink; hearing intact; oropharynx clear; neck supple Lungs- Clear to ausculation bilaterally, normal work of breathing.  No wheezes, rales, rhonchi Heart- Regular rate and rhythm, no murmurs, rubs or gallops GI- soft, non-tender, non-distended, bowel sounds present Extremities- no clubbing, cyanosis, or edema; DP/PT/radial pulses 2+ bilaterally MS- no significant deformity or atrophy Skin- warm and dry, no rash or lesion Psych- euthymic  mood, full affect Neuro- strength and sensation are intact  Labs:   Lab Results  Component Value Date   WBC 7.1 07/17/2020   HGB 14.7 07/17/2020   HCT 42.5 07/17/2020   MCV 91.0 07/17/2020   PLT 290 07/17/2020   No results for input(s): NA, K, CL, CO2, BUN, CREATININE, CALCIUM, PROT, BILITOT, ALKPHOS, ALT, AST, GLUCOSE in the last 168 hours.  Invalid input(s): LABALBU    Radiology/Studies: No results found.  KHT:XHFSFSE (personally reviewed)  TELEMETRY: Being connected (personally reviewed)   Assessment/Plan: 1.  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 Quentin Ore In shared decision making it was felt best to try a different AAD prior to another ablation attempt.  Amiodarone may be an option in the future as well Plan on starting sotalol tonight, likely at 80 mg BID PENDING ECG AND LABWORK.   For questions or updates, please contact [place] Please consult [email or web address] for contact info under Cardiology/STEMI.  Jacalyn Lefevre, PA-C  12/09/2020 4:59 PM

## 2020-12-10 ENCOUNTER — Other Ambulatory Visit (HOSPITAL_COMMUNITY): Payer: Self-pay

## 2020-12-10 DIAGNOSIS — I472 Ventricular tachycardia, unspecified: Secondary | ICD-10-CM | POA: Diagnosis not present

## 2020-12-10 LAB — BASIC METABOLIC PANEL
Anion gap: 6 (ref 5–15)
BUN: 12 mg/dL (ref 6–20)
CO2: 21 mmol/L — ABNORMAL LOW (ref 22–32)
Calcium: 8.5 mg/dL — ABNORMAL LOW (ref 8.9–10.3)
Chloride: 107 mmol/L (ref 98–111)
Creatinine, Ser: 0.75 mg/dL (ref 0.61–1.24)
GFR, Estimated: >60 mL/min (ref >60)
Glucose, Bld: 102 mg/dL — ABNORMAL HIGH (ref 70–99)
Potassium: 3.9 mmol/L (ref 3.5–5.1)
Sodium: 134 mmol/L — ABNORMAL LOW (ref 135–145)

## 2020-12-10 LAB — MAGNESIUM: Magnesium: 2.2 mg/dL (ref 1.7–2.4)

## 2020-12-10 MED ORDER — METOPROLOL SUCCINATE ER 50 MG PO TB24
50.0000 mg | ORAL_TABLET | Freq: Every day | ORAL | Status: DC
  Administered 2020-12-10 – 2020-12-12 (×3): 50 mg via ORAL
  Filled 2020-12-10 (×2): qty 1

## 2020-12-10 MED ORDER — SOTALOL HCL 120 MG PO TABS
120.0000 mg | ORAL_TABLET | Freq: Two times a day (BID) | ORAL | Status: DC
  Administered 2020-12-10 – 2020-12-12 (×5): 120 mg via ORAL
  Filled 2020-12-10 (×6): qty 1

## 2020-12-10 MED ORDER — POTASSIUM CHLORIDE CRYS ER 20 MEQ PO TBCR
40.0000 meq | EXTENDED_RELEASE_TABLET | Freq: Once | ORAL | Status: AC
  Administered 2020-12-10: 40 meq via ORAL

## 2020-12-10 MED ORDER — ACETAMINOPHEN 325 MG PO TABS
ORAL_TABLET | ORAL | Status: AC
  Filled 2020-12-10: qty 2

## 2020-12-10 MED ORDER — OFF THE BEAT BOOK
Freq: Once | Status: AC
  Filled 2020-12-10: qty 1

## 2020-12-10 MED ORDER — POTASSIUM CHLORIDE CRYS ER 20 MEQ PO TBCR
EXTENDED_RELEASE_TABLET | ORAL | Status: AC
  Filled 2020-12-10: qty 2

## 2020-12-10 MED ORDER — METOPROLOL SUCCINATE ER 50 MG PO TB24
ORAL_TABLET | ORAL | Status: AC
  Filled 2020-12-10: qty 1

## 2020-12-10 MED ORDER — ACETAMINOPHEN 325 MG PO TABS
650.0000 mg | ORAL_TABLET | Freq: Four times a day (QID) | ORAL | Status: DC | PRN
  Administered 2020-12-10: 325 mg via ORAL

## 2020-12-10 NOTE — TOC Benefit Eligibility Note (Signed)
Patient Teacher, English as a foreign language completed.    The patient is currently admitted and upon discharge could be taking sotalol ([place]) 120 mg tablet.  The current 30 day co-pay is, $0.00.   The patient is insured through [place], [place] Patient Advocate Specialist [place] Patient Advocate Team Direct Number: [phone]  Fax: [phone]

## 2020-12-10 NOTE — Progress Notes (Signed)
Morning EKG reviewed    Shows Sinus bradycardia at 56 bpm with stable QTc at 430 ms.  Continue Sotalol 120 mg BID. No PVCs on EKG   Shirley Friar, [place]  Pager: [phone]  12/10/2020 11:50 AM

## 2020-12-10 NOTE — Progress Notes (Signed)
Electrophysiology Rounding Note  Patient Name: Kyle Kim Date of Encounter: 12/10/2020  Primary Cardiologist: Ida Rogue, MD  Electrophysiologist: Vickie Epley, MD    Subjective   Pt  continues to have frequent PVCs  on Sotalol 80 mg BID  QTc from EKG last pm shows stable QTc at ~470  The patient is doing well today.  At this time, the patient denies chest pain, shortness of breath, or any new concerns.  Inpatient Medications    Scheduled Meds:  metoprolol succinate  100 mg Oral Daily   montelukast  10 mg Oral Daily   off the beat book   Does not apply Once   potassium chloride  40 mEq Oral Once   sodium chloride flush  3 mL Intravenous Q12H   sotalol  120 mg Oral Q12H   Continuous Infusions:  sodium chloride     PRN Meds: sodium chloride, acetaminophen, albuterol, cyclobenzaprine, [START ON 12/12/2020] influenza vac split quadrivalent PF, sodium chloride flush   Vital Signs    Vitals:   12/09/20 1616 12/09/20 2023 12/10/20 0029 12/10/20 0531  BP: (!) 133/91 (!) 144/73 (!) 104/57 (!) 102/53  Pulse: 63  73 67  Resp:  16 17 19   Temp: 98.1 F (36.7 C) 97.9 F (36.6 C) 98.4 F (36.9 C) 98.2 F (36.8 C)  TempSrc: Oral Oral Oral Oral  SpO2: 97% 97% 99% 97%  Weight: 94.3 kg     Height: 5\' 9"  (1.753 m)       Intake/Output Summary (Last 24 hours) at 12/10/2020 0818 Last data filed at 12/10/2020 0257 Gross per 24 hour  Intake 53 ml  Output --  Net 53 ml   Filed Weights   12/09/20 1616  Weight: 94.3 kg    Physical Exam    GEN- The patient is well appearing, alert and oriented x 3 today.   Head- normocephalic, atraumatic Eyes-  Sclera clear, conjunctiva pink Ears- hearing intact Oropharynx- clear Neck- supple Lungs- Clear to ausculation bilaterally, normal work of breathing Heart-  Irregular due to ectopy , no murmurs, rubs or gallops GI- soft, NT, ND, + BS Extremities- no clubbing, cyanosis, or edema Skin- no rash or lesion Psych-  euthymic mood, full affect Neuro- strength and sensation are intact  Labs    CBC No results for input(s): WBC, NEUTROABS, HGB, HCT, MCV, PLT in the last 72 hours. Basic Metabolic Panel Recent Labs    12/09/20 1647 12/10/20 0116  NA 138 134*  K 3.8 3.9  CL 108 107  CO2 23 21*  GLUCOSE 99 102*  BUN 14 12  CREATININE 0.80 0.75  CALCIUM 8.6* 8.5*  MG 2.0 2.2    Potassium  Date/Time Value Ref Range Status  12/10/2020 01:16 AM 3.9 3.5 - 5.1 mmol/L Final   Magnesium  Date/Time Value Ref Range Status  12/10/2020 01:16 AM 2.2 1.7 - 2.4 mg/dL Final    Comment:    Performed at [facility], [place] [address], [place], [place] [address]    Telemetry    NSR 60s with occasional PVCs (personally reviewed)  Radiology    No results found.   Patient Profile     Kyle Kim is a 60 y.o. male with a past medical history significant for persistent atrial fibrillation.  They were admitted for tikosyn load.   Assessment & Plan    PVCs Continues to have relatively frequent PVCs.  QTc stable Increase sotalol to 120 mg BID K 3.9. Supp Mg 2.2  For questions or updates, please contact [place] Please consult [email or web address] for contact info under Cardiology/STEMI.  Signed, Shirley Friar, PA-C  12/10/2020, 8:18 AM

## 2020-12-10 NOTE — Progress Notes (Signed)
Pharmacy Consult for Sotalol Electrolyte Replacement- Follow Up  Pharmacy consulted to assist in monitoring and replacing electrolytes in this 60 y.o. male admitted on 12/09/2020 undergoing sotalol initiation . First sotalol dose: 12/09/20  Labs:    Component Value Date/Time   K 3.9 12/10/2020 0116   MG 2.2 12/10/2020 0116     Plan: Potassium: K 3.8-3.9:  Give KCl 40 mEq po x1   Magnesium: Mg > 2: No additional supplementation needed   Thank you for allowing pharmacy to participate in this patient's care    Arrie Senate, PharmD, BCPS, [facility] Clinical Pharmacist [phone] Please check AMION for all [place] numbers 12/10/2020

## 2020-12-11 DIAGNOSIS — I472 Ventricular tachycardia, unspecified: Secondary | ICD-10-CM | POA: Diagnosis not present

## 2020-12-11 LAB — BASIC METABOLIC PANEL
Anion gap: 5 (ref 5–15)
BUN: 13 mg/dL (ref 6–20)
CO2: 23 mmol/L (ref 22–32)
Calcium: 9 mg/dL (ref 8.9–10.3)
Chloride: 105 mmol/L (ref 98–111)
Creatinine, Ser: 0.82 mg/dL (ref 0.61–1.24)
GFR, Estimated: >60 mL/min (ref >60)
Glucose, Bld: 107 mg/dL — ABNORMAL HIGH (ref 70–99)
Potassium: 4.2 mmol/L (ref 3.5–5.1)
Sodium: 133 mmol/L — ABNORMAL LOW (ref 135–145)

## 2020-12-11 LAB — MAGNESIUM: Magnesium: 1.9 mg/dL (ref 1.7–2.4)

## 2020-12-11 MED ORDER — MAGNESIUM SULFATE 2 GM/50ML IV SOLN
2.0000 g | Freq: Once | INTRAVENOUS | Status: AC
  Administered 2020-12-11: 2 g via INTRAVENOUS
  Filled 2020-12-11: qty 50

## 2020-12-11 NOTE — Progress Notes (Signed)
Pharmacy Consult for Sotalol Electrolyte Replacement- Follow Up  Pharmacy consulted to assist in monitoring and replacing electrolytes in this 60 y.o. male admitted on 12/09/2020 undergoing sotalol initiation . First sotalol dose: 12/09/20  Labs:    Component Value Date/Time   K 4.2 12/11/2020 0238   MG 1.9 12/11/2020 0238     Plan: Potassium: K >/= 4: No additional supplementation needed  Magnesium: Mg 1.8-2: Give Mg 2 gm IV x1    Thank you for allowing pharmacy to participate in this patient's care    Arrie Senate, PharmD, BCPS, [facility] Clinical Pharmacist [phone] Please check AMION for all [place] numbers 12/11/2020

## 2020-12-11 NOTE — Progress Notes (Signed)
Morning EKG reviewed    Shows NSR with stable QTc at ~410-420 ms.  Continue Sotalol 120 mg BID.   Plan for home tomorrow if QTc remains stable and no further adjustments need to be made to sotalol.    Shirley Friar, PA-C  Pager: [phone]  12/11/2020 11:40 AM

## 2020-12-11 NOTE — Progress Notes (Addendum)
Electrophysiology Rounding Note  Patient Name: Kyle Kim Date of Encounter: 12/11/2020  Primary Cardiologist: Ida Rogue, MD  Electrophysiologist: Vickie Epley, MD    Subjective   Pt  with significantly improved PVC burden  on Sotalol  120 mg  BID   QTc from EKG last pm shows stable QTc at ~420  The patient is doing well today.  At this time, the patient denies chest pain, shortness of breath, or any new concerns. His palpitations feel significantly improved  Inpatient Medications    Scheduled Meds:  metoprolol succinate  50 mg Oral Daily   montelukast  10 mg Oral Daily   sodium chloride flush  3 mL Intravenous Q12H   sotalol  120 mg Oral Q12H   Continuous Infusions:  sodium chloride     magnesium sulfate bolus IVPB     PRN Meds: sodium chloride, acetaminophen, albuterol, cyclobenzaprine, [START ON 12/12/2020] influenza vac split quadrivalent PF, sodium chloride flush   Vital Signs    Vitals:   12/10/20 0856 12/10/20 1809 12/10/20 2001 12/11/20 0559  BP: 108/71 126/89 140/70 112/64  Pulse: 65 60 60 (!) 51  Resp:  17 18 19   Temp:  98 F (36.7 C) 98.2 F (36.8 C) 97.9 F (36.6 C)  TempSrc:  Oral Oral Oral  SpO2:  98% 93% 97%  Weight:      Height:        Intake/Output Summary (Last 24 hours) at 12/11/2020 0832 Last data filed at 12/10/2020 2100 Gross per 24 hour  Intake 240 ml  Output --  Net 240 ml   Filed Weights   12/09/20 1616  Weight: 94.3 kg    Physical Exam    GEN- The patient is well appearing, alert and oriented x 3 today.   Head- normocephalic, atraumatic Eyes-  Sclera clear, conjunctiva pink Ears- hearing intact Oropharynx- clear Neck- supple Lungs- Clear to ausculation bilaterally, normal work of breathing Heart- Regular rate and rhythm, no murmurs, rubs or gallops GI- soft, NT, ND, + BS Extremities- no clubbing, cyanosis, or edema Skin- no rash or lesion Psych- euthymic mood, full affect Neuro- strength and  sensation are intact  Labs    CBC No results for input(s): WBC, NEUTROABS, HGB, HCT, MCV, PLT in the last 72 hours. Basic Metabolic Panel Recent Labs    12/10/20 0116 12/11/20 0238  NA 134* 133*  K 3.9 4.2  CL 107 105  CO2 21* 23  GLUCOSE 102* 107*  BUN 12 13  CREATININE 0.75 0.82  CALCIUM 8.5* 9.0  MG 2.2 1.9    Potassium  Date/Time Value Ref Range Status  12/11/2020 02:38 AM 4.2 3.5 - 5.1 mmol/L Final   Magnesium  Date/Time Value Ref Range Status  12/11/2020 02:38 AM 1.9 1.7 - 2.4 mg/dL Final    Comment:    Performed at [facility], [place] [address], [place], [place] [address]    Telemetry    NSR with PVC burden much improved (personally reviewed)  Radiology    No results found.   Patient Profile     Kyle Kim is a 60 y.o. male with a past medical history significant for persistent atrial fibrillation.  They were admitted for tikosyn load.   Assessment & Plan    PVCs, symptomatic Burden signficantly improved on 120 mg BID of sotalol. K 4.2. Mg 1.9. Supp.  Continue metoprolol 50 mg daily for now.  Potentially home tomorrow if QTc remains stable   For questions or updates,  please contact [place] Please consult [email or web address] for contact info under Cardiology/STEMI.  Signed, Kyle Friar, PA-C  12/11/2020, 8:32 AM   I have seen and examined this patient with Kyle Kim.  Agree with above, note added to reflect my findings.  On exam, RRR, no murmurs, lungs clear, no edema, no JVD.  Patient currently on sotalol for PVC suppression.  Overnight, no further PVCs.  We Kyle Kim continue sotalol 120 mg twice daily.  Potassium and magnesium have remained stable.  If QT remained stable throughout the day, we Kyle Kim plan for likely discharge.  Kyle Kim M. Maalle Starrett MD 12/11/2020 10:58 AM

## 2020-12-12 ENCOUNTER — Other Ambulatory Visit (HOSPITAL_COMMUNITY): Payer: Self-pay

## 2020-12-12 LAB — BASIC METABOLIC PANEL
Anion gap: 9 (ref 5–15)
BUN: 16 mg/dL (ref 6–20)
CO2: 24 mmol/L (ref 22–32)
Calcium: 9.1 mg/dL (ref 8.9–10.3)
Chloride: 103 mmol/L (ref 98–111)
Creatinine, Ser: 0.82 mg/dL (ref 0.61–1.24)
GFR, Estimated: >60 mL/min (ref >60)
Glucose, Bld: 103 mg/dL — ABNORMAL HIGH (ref 70–99)
Potassium: 4 mmol/L (ref 3.5–5.1)
Sodium: 136 mmol/L (ref 135–145)

## 2020-12-12 LAB — MAGNESIUM: Magnesium: 1.8 mg/dL (ref 1.7–2.4)

## 2020-12-12 MED ORDER — MAGNESIUM OXIDE -MG SUPPLEMENT 400 (240 MG) MG PO TABS: 400.0000 mg | ORAL_TABLET | Freq: Every day | ORAL | Status: DC

## 2020-12-12 MED ORDER — SOTALOL HCL 120 MG PO TABS
120.0000 mg | ORAL_TABLET | Freq: Two times a day (BID) | ORAL | 6 refills | Status: DC
  Filled 2020-12-12: qty 60, 30d supply, fill #0
  Filled 2020-12-29: qty 60, 30d supply, fill #1

## 2020-12-12 MED ORDER — MAGNESIUM SULFATE 4 GM/100ML IV SOLN
4.0000 g | Freq: Once | INTRAVENOUS | Status: AC
  Administered 2020-12-12: 4 g via INTRAVENOUS
  Filled 2020-12-12: qty 100

## 2020-12-12 MED ORDER — POTASSIUM CHLORIDE CRYS ER 20 MEQ PO TBCR
20.0000 meq | EXTENDED_RELEASE_TABLET | Freq: Once | ORAL | Status: AC
  Administered 2020-12-12: 20 meq via ORAL
  Filled 2020-12-12: qty 1

## 2020-12-12 MED ORDER — MAGNESIUM OXIDE 400 MG PO TABS
400.0000 mg | ORAL_TABLET | Freq: Every day | ORAL | 6 refills | Status: DC
  Filled 2020-12-12: qty 30, 30d supply, fill #0
  Filled 2020-12-29: qty 30, 30d supply, fill #1

## 2020-12-12 MED ORDER — METOPROLOL SUCCINATE ER 50 MG PO TB24
50.0000 mg | ORAL_TABLET | Freq: Every day | ORAL | 6 refills | Status: DC
  Filled 2020-12-12: qty 30, 30d supply, fill #0
  Filled 2020-12-29: qty 30, 30d supply, fill #1

## 2020-12-12 NOTE — Progress Notes (Signed)
EKG from yesterday evening 12/11/20 reviewed    Shows stable QTc at 430-440 ms.  Continue  Sotalol 120 mg BID.   K 4.0. Mg 1.8 Will supp both. Will start daily Mag on d/c with multiple supps.  Plan for home this afternoon if QTc remains stable after am dose.     Shirley Friar, PA-C  Pager: [phone]  12/12/2020 7:06 AM

## 2020-12-12 NOTE — Progress Notes (Signed)
Pharmacy Consult for Sotalol Electrolyte Replacement- Follow Up  Pharmacy consulted to assist in monitoring and replacing electrolytes in this 60 y.o. male admitted on 12/09/2020 undergoing sotalol initiation . First sotalol dose: 12/09/20  Labs:    Component Value Date/Time   K 4.0 12/12/2020 0233   MG 1.8 12/12/2020 0233     Plan: Potassium: K >/= 4: No additional supplementation needed >> giving 20 meq per EP  Magnesium: Mg 1.8-2: Give Mg 2 gm IV x1  >> giving 4g per EP  Would consider discharging on 20 mEq KCl daily  Thank you for allowing pharmacy to participate in this patient's care    Arrie Senate, PharmD, BCPS, [place] Pharmacist [phone] Please check AMION for all [place] numbers 12/12/2020

## 2020-12-12 NOTE — Discharge Summary (Signed)
ELECTROPHYSIOLOGY PROCEDURE DISCHARGE SUMMARY    Patient ID: Kyle Kim,  MRN: [id number], DOB/AGE: 1960-05-27 60 y.o.  Admit date: 12/09/2020 Discharge date: 12/12/2020  Primary Care Physician: Maryland Pink, MD  Primary Cardiologist: Ida Rogue, MD  Electrophysiologist: Vickie Epley, MD   Primary Discharge Diagnosis:  1.  PVCs, symptomatic.   Allergies  Allergen Reactions   Black Walnut Flavor Shortness Of Breath and Swelling   Cholestatin Shortness Of Breath and Swelling   Pecan Nut (Diagnostic) Shortness Of Breath    All tree nuts cause throat swelling and "asthma-like" reaction   Quinolones Other (See Comments)    Fluoroquinolones not recommended in patients with aortic root dilation     Procedures This Admission:  1. Sotalol loading   Brief HPI: Kyle Kim is a 60 y.o. male with a past medical history as noted above.  He has been followed by EP in the outpatient setting for PVCs, having previously failed an ablation.  Risks, benefits, and alternatives to Sotalol were reviewed with the patient who wished to proceed.    Hospital Course:  The patient was admitted and sotalol was initiated.  Renal function and electrolytes were followed during the hospitalization.  Their QTc remained stable.  They were monitored until discharge on telemetry which demonstrated significant improvement of his PVC burden.  On the day of discharge, they were examined by Dr. Quentin Ore  who considered them stable for discharge to home.  Follow-up has been arranged with the EP-APP in approximately 1 week and with Dr. Quentin Ore  in 3 months.   Physical Exam: Vitals:   12/11/20 0559 12/11/20 1419 12/11/20 2010 12/12/20 0630  BP: 112/64 (!) 101/56 122/84 109/69  Pulse: (!) 51 62 61 63  Resp: 19 20 16 15   Temp: 97.9 F (36.6 C) 98.3 F (36.8 C) 98.3 F (36.8 C) 98.3 F (36.8 C)  TempSrc: Oral Oral Oral Oral  SpO2: 97% 100% 100% 100%  Weight:      Height:        GEN- The  patient is well appearing, alert and oriented x 3 today.   HEENT: normocephalic, atraumatic; sclera clear, conjunctiva pink; hearing intact; oropharynx clear; neck supple, no JVP Lymph- no cervical lymphadenopathy Lungs- Clear to ausculation bilaterally, normal work of breathing.  No wheezes, rales, rhonchi Heart- Regular rate and rhythm, no murmurs, rubs or gallops, PMI not laterally displaced GI- soft, non-tender, non-distended, bowel sounds present, no hepatosplenomegaly Extremities- no clubbing, cyanosis, or edema; DP/PT/radial pulses 2+ bilaterally MS- no significant deformity or atrophy Skin- warm and dry, no rash or lesion Psych- euthymic mood, full affect Neuro- strength and sensation are intact   Labs:   Lab Results  Component Value Date   WBC 7.1 07/17/2020   HGB 14.7 07/17/2020   HCT 42.5 07/17/2020   MCV 91.0 07/17/2020   PLT 290 07/17/2020    Recent Labs  Lab 12/12/20 0233  NA 136  K 4.0  CL 103  CO2 24  BUN 16  CREATININE 0.82  CALCIUM 9.1  GLUCOSE 103*     Discharge Medications:  Allergies as of 12/12/2020       Reactions   Black [employer]  Shortness Of Breath, Swelling   Cholestatin Shortness Of Breath, Swelling   Pecan Nut (diagnostic) Shortness Of Breath   All tree nuts cause throat swelling and "asthma-like" reaction   Quinolones Other (See Comments)   Fluoroquinolones not recommended in patients with aortic root dilation  Medication List     TAKE these medications    acetaminophen 500 MG tablet Commonly known as: TYLENOL Take 1,000 mg by mouth every 6 (six) hours as needed for moderate pain or mild pain. Every three days   albuterol 108 (90 Base) MCG/ACT inhaler Commonly known as: VENTOLIN HFA Inhale 2 puffs into the lungs every 6 (six) hours as needed for wheezing or shortness of breath.   aspirin 325 MG tablet Take 650 mg by mouth every 6 (six) hours as needed for moderate pain or mild pain (Back Pain). Every three days    cyclobenzaprine 5 MG tablet Commonly known as: FLEXERIL Take 5 mg by mouth 3 (three) times daily as needed (Back Spasm).   fluticasone 50 MCG/ACT nasal spray Commonly known as: FLONASE Place 2 sprays into both nostrils 2 (two) times daily.   Xhance 93 MCG/ACT Exhu Generic drug: Fluticasone Propionate Place 2 puffs into the nose 2 (two) times daily.   ibuprofen 200 MG tablet Commonly known as: ADVIL Take 400 mg by mouth every 8 (eight) hours as needed for moderate pain or mild pain (Back pain).   magnesium oxide 400 (240 Mg) MG tablet Commonly known as: MAG-OX Take 1 tablet (400 mg total) by mouth daily. Start taking on: December 13, 2020   metoprolol succinate 50 MG 24 hr tablet Commonly known as: TOPROL-XL Take 1 tablet (50 mg total) by mouth daily. Take with or immediately following a meal. Start taking on: December 13, 2020 What changed:  medication strength how much to take   montelukast 10 MG tablet Commonly known as: SINGULAIR Take 10 mg by mouth daily.   sildenafil 50 MG tablet Commonly known as: VIAGRA Take 50 mg by mouth as needed for erectile dysfunction.   sotalol 120 MG tablet Commonly known as: BETAPACE Take 1 tablet (120 mg total) by mouth every 12 (twelve) hours.        Disposition:    Follow-up Information     Shirley Friar, PA-C Follow up.   Specialty: Physician Assistant Why: on 12/8 at 1220 for post sotalol follow up Contact information: [place] [address] [phone]                 Duration of Discharge Encounter: Greater than 30 minutes including physician time.  Jacalyn Lefevre, PA-C  12/12/2020 11:51 AM

## 2020-12-18 NOTE — Progress Notes (Signed)
PCP:  Maryland Pink, MD Primary Cardiologist: Ida Rogue, MD Electrophysiologist: Vickie Epley, MD   Kyle Kim is a 60 y.o. male seen today for Vickie Epley, MD for post hospital follow up for sotalol loading.    Since discharge from hospital the patient reports doing very well. Had brief palpitations last night in the setting of a nightmare, otherwise none since leaving hospital.  he denies chest pain, dyspnea, PND, orthopnea, nausea, vomiting, dizziness, syncope, edema, weight gain, or early satiety.  Past Medical History:  Diagnosis Date   Arthritis    lower back   Asthma    Skin cancer 04/13/2016   left neck   Wears contact lenses    Past Surgical History:  Procedure Laterality Date   COLONOSCOPY     ETHMOIDECTOMY Bilateral 11/10/2016   Procedure: ETHMOIDECTOMY;  Surgeon: Clyde Canterbury, MD;  Location: [facility];  Service: ENT;  Laterality: Bilateral;   FRONTAL SINUS EXPLORATION Bilateral 11/10/2016   Procedure: FRONTAL SINUS EXPLORATION;  Surgeon: Clyde Canterbury, MD;  Location: [facility];  Service: ENT;  Laterality: Bilateral;   IMAGE GUIDED SINUS SURGERY N/A 11/10/2016   Procedure: IMAGE GUIDED SINUS SURGERY;  Surgeon: Clyde Canterbury, MD;  Location: [facility];  Service: ENT;  Laterality: N/A;  GAVE DISK TO [facility] 10-23   LEFT HEART CATH AND CORONARY ANGIOGRAPHY N/A 05/30/2020   Procedure: LEFT HEART CATH AND CORONARY ANGIOGRAPHY;  Surgeon: Minna Merritts, MD;  Location: [facility];  Service: Cardiovascular;  Laterality: N/A;   MAXILLARY ANTROSTOMY Bilateral 11/10/2016   Procedure: MAXILLARY ANTROSTOMY WITH TISSUE REMOVAL;  Surgeon: Clyde Canterbury, MD;  Location: [facility];  Service: ENT;  Laterality: Bilateral;   PVC ABLATION N/A 07/22/2020   Procedure: PVC ABLATION;  Surgeon: Vickie Epley, MD;  Location: [facility];  Service: Cardiovascular;  Laterality: N/A;   SPHENOIDECTOMY Bilateral 11/10/2016    Procedure: SPHENOIDECTOMY;  Surgeon: Clyde Canterbury, MD;  Location: [facility];  Service: ENT;  Laterality: Bilateral;    Current Outpatient Medications  Medication Sig Dispense Refill   acetaminophen (TYLENOL) 500 MG tablet Take 1,000 mg by mouth every 6 (six) hours as needed for moderate pain or mild pain. Every three days     albuterol (PROVENTIL HFA;VENTOLIN HFA) 108 (90 Base) MCG/ACT inhaler Inhale 2 puffs into the lungs every 6 (six) hours as needed for wheezing or shortness of breath.     aspirin 325 MG tablet Take 650 mg by mouth every 6 (six) hours as needed for moderate pain or mild pain (Back Pain). Every three days     cyclobenzaprine (FLEXERIL) 5 MG tablet Take 5 mg by mouth 3 (three) times daily as needed (Back Spasm).     fluticasone (FLONASE) 50 MCG/ACT nasal spray Place 2 sprays into both nostrils 2 (two) times daily.     ibuprofen (ADVIL) 200 MG tablet Take 400 mg by mouth every 8 (eight) hours as needed for moderate pain or mild pain (Back pain).     magnesium oxide (MAG-OX) 400 MG tablet Take 1 tablet (400 mg total) by mouth daily. 30 tablet 6   metoprolol succinate (TOPROL-XL) 50 MG 24 hr tablet Take 1 tablet (50 mg total) by mouth daily. Take with or immediately following a meal. 30 tablet 6   montelukast (SINGULAIR) 10 MG tablet Take 10 mg by mouth daily.     sildenafil (VIAGRA) 50 MG tablet Take 50 mg by mouth as needed for erectile dysfunction.  sotalol (BETAPACE) 120 MG tablet Take 1 tablet (120 mg total) by mouth every 12 (twelve) hours. 60 tablet 6   XHANCE 93 MCG/ACT EXHU Place 2 puffs into the nose 2 (two) times daily.     Azelastine HCl 137 MCG/SPRAY SOLN SMARTSIG:1-2 Spray(s) Both Nares Twice Daily (Patient not taking: Reported on 12/19/2020)     No current facility-administered medications for this visit.    Allergies  Allergen Reactions   Black Walnut Flavor Shortness Of Breath and Swelling   Cholestatin Shortness Of Breath and Swelling   Pecan  Nut (Diagnostic) Shortness Of Breath    All tree nuts cause throat swelling and "asthma-like" reaction   Quinolones Other (See Comments)    Fluoroquinolones not recommended in patients with aortic root dilation    Social History   Socioeconomic History   Marital status: Married    Spouse name: Not on file   Number of children: Not on file   Years of education: Not on file   Highest education level: Not on file  Occupational History   Not on file  Tobacco Use   Smoking status: Former    Types: Cigarettes    Quit date: 2008    Years since quitting: 14.9   Smokeless tobacco: Never  Vaping Use   Vaping Use: Never used  Substance and Sexual Activity   Alcohol use: Yes    Alcohol/week: 14.0 standard drinks    Types: 14 Cans of beer per week    Comment: 1-2 beers per days 5-6 days per week   Drug use: Never   Sexual activity: Not on file  Other Topics Concern   Not on file  Social History Narrative   Not on file   Social Determinants of Health   Financial Resource Strain: Not on file  Food Insecurity: Not on file  Transportation Needs: Not on file  Physical Activity: Not on file  Stress: Not on file  Social Connections: Not on file  Intimate Partner Violence: Not on file     Review of Systems: All other systems reviewed and are otherwise negative except as noted above.  Physical Exam: Vitals:   12/19/20 1236  BP: 120/80  Pulse: (!) 57  SpO2: 97%  Weight: 206 lb 6.4 oz (93.6 kg)  Height: 5\' 10"  (1.778 m)    GEN- The patient is well appearing, alert and oriented x 3 today.   HEENT: normocephalic, atraumatic; sclera clear, conjunctiva pink; hearing intact; oropharynx clear; neck supple, no JVP Lymph- no cervical lymphadenopathy Lungs- Clear to ausculation bilaterally, normal work of breathing.  No wheezes, rales, rhonchi Heart- Regular rate and rhythm, no murmurs, rubs or gallops, PMI not laterally displaced GI- soft, non-tender, non-distended, bowel sounds  present, no hepatosplenomegaly Extremities- no clubbing, cyanosis, or edema; DP/PT/radial pulses 2+ bilaterally MS- no significant deformity or atrophy Skin- warm and dry, no rash or lesion Psych- euthymic mood, full affect Neuro- strength and sensation are intact  EKG is ordered. Personal review of EKG from today shows sinus brady at 57 bpm with stable QTc  Additional studies reviewed include: Previous EP and admission notes.   Assessment and Plan:  1. PVCs Stable QTc and minimal symptoms on sotalol Labs today.  Doing very well. Instructed to avoid potential triggers such as alcohol or caffeine. OK in moderation if not triggers for him.    Follow up with Dr. Quentin Ore in 3 months   Shirley Friar, PA-C  12/19/20 12:49 PM

## 2020-12-19 ENCOUNTER — Encounter: Payer: Self-pay | Admitting: Student

## 2020-12-19 ENCOUNTER — Other Ambulatory Visit: Payer: Self-pay

## 2020-12-19 ENCOUNTER — Ambulatory Visit (INDEPENDENT_AMBULATORY_CARE_PROVIDER_SITE_OTHER): Payer: No Typology Code available for payment source | Admitting: Student

## 2020-12-19 VITALS — BP 120/80 | HR 57 | Ht 70.0 in | Wt 206.4 lb

## 2020-12-19 DIAGNOSIS — I493 Ventricular premature depolarization: Secondary | ICD-10-CM | POA: Diagnosis not present

## 2020-12-19 DIAGNOSIS — I472 Ventricular tachycardia, unspecified: Secondary | ICD-10-CM

## 2020-12-19 LAB — BASIC METABOLIC PANEL
BUN/Creatinine Ratio: 18 (ref 9–20)
BUN: 14 mg/dL (ref 6–24)
CO2: 23 mmol/L (ref 20–29)
Calcium: 8.9 mg/dL (ref 8.7–10.2)
Chloride: 104 mmol/L (ref 96–106)
Creatinine, Ser: 0.8 mg/dL (ref 0.76–1.27)
Glucose: 214 mg/dL — ABNORMAL HIGH (ref 70–99)
Potassium: 4.5 mmol/L (ref 3.5–5.2)
Sodium: 139 mmol/L (ref 134–144)
eGFR: 103 mL/min/{1.73_m2} (ref >59)

## 2020-12-19 LAB — MAGNESIUM: Magnesium: 2.1 mg/dL (ref 1.6–2.3)

## 2020-12-19 NOTE — Patient Instructions (Signed)
Medication Instructions:  Your physician recommends that you continue on your current medications as directed. Please refer to the Current Medication list given to you today.  *If you need a refill on your cardiac medications before your next appointment, please call your pharmacy*   Lab Work: TODAY: BMET, MG  If you have labs (blood work) drawn today and your tests are completely normal, you will receive your results only by: [place] (if you have MyChart) OR A paper copy in the mail If you have any lab test that is abnormal or we need to change your treatment, we will call you to review the results.   Follow-Up: At [facility], you and your health needs are our priority.  As part of our continuing mission to provide you with exceptional heart care, we have created designated Provider Care Teams.  These Care Teams include your primary Cardiologist (physician) and Advanced Practice Providers (APPs -  Physician Assistants and Nurse Practitioners) who all work together to provide you with the care you need, when you need it.   Your next appointment:   As scheduled

## 2020-12-30 ENCOUNTER — Other Ambulatory Visit (HOSPITAL_COMMUNITY): Payer: Self-pay

## 2021-01-07 MED ORDER — SOTALOL HCL 120 MG PO TABS: 120.0000 mg | ORAL_TABLET | Freq: Two times a day (BID) | ORAL | 6 refills | Status: DC

## 2021-01-16 ENCOUNTER — Telehealth: Payer: Self-pay | Admitting: Genetic Counselor

## 2021-01-16 NOTE — Telephone Encounter (Signed)
Called patient to schedule follow up appointment with Dr. Broadus John. He states that he got two letter's in the mail from his previous appointment with her stating that the appointment was approved and the other stated that it was not approved. He says that there was estimate of $12,000. He was not sure if this is correct. Does anyone know how how the billing for these genetic appointments work?

## 2021-02-12 NOTE — Telephone Encounter (Signed)
Received more forms for FMLA to be completed due to questions on initial forms needing reasoning for reduced work. Faxed these forms to Saddie Benders, Therapist, sports at [facility] in [place] to relay to Dr. Quentin Ore

## 2021-02-20 ENCOUNTER — Telehealth: Payer: Self-pay | Admitting: Cardiology

## 2021-02-20 NOTE — Telephone Encounter (Signed)
Noted- see phone note from today.

## 2021-02-20 NOTE — Telephone Encounter (Signed)
Contacted by Janan Halter, RN last night that [place], RN was attempting to send in completed paperwork for the patient's FMLA. However, this could not be faxed as there was no fax # included.   Claiborne Billings advised Otila Kluver to leave this paperwork for me to look at today.  I have gone back in the patient's chart and found that previous (same) FMLA paperwork was advised to be faxed to:  FMLA [employer]/ [employer] [phone] in 10/2020.  I have faxed this most recently updated FMLA paperwork to the Fax # above. Fax confirmation received.   I have also sent the patient a MyChart message advising that his paper work had been sent to the # above as no updated # was given with his most recent request.

## 2021-03-27 ENCOUNTER — Ambulatory Visit: Payer: No Typology Code available for payment source | Admitting: Cardiology

## 2021-06-02 MED ORDER — METOPROLOL SUCCINATE ER 50 MG PO TB24: 50.0000 mg | ORAL_TABLET | Freq: Every day | ORAL | 0 refills | Status: DC

## 2021-06-25 NOTE — Progress Notes (Signed)
Electrophysiology Office Follow up Visit Note:    Date:  06/26/2021   ID:  Kyle Kim, DOB 16-Jul-1960, MRN [id number]  PCP:  Maryland Pink, MD  [facility] Cardiologist:  Ida Rogue, MD  [facility] Electrophysiologist:  Vickie Epley, MD    Interval History:    Kyle Kim is a 61 y.o. male who presents for a follow up visit. They were last seen in clinic 10/16/2020.  Since their last appointment, they followed up with Barrington Ellison, PA-C on 12/19/20 for post hospital follow up for sotalol loading. He was feeling well. One night prior to his visit he had brief palpitations in the setting of a nightmare, but there were no other palpitations since discharge from the hospital. His EKG showed sinus bradycardia at 57 bpm with stable QTc. He was instructed to avoid potential triggers such as alcohol and caffeine.   Today, he is feeling good. Occasionally he notices his heart rate may be slow, but he denies any recent arrhythmic episodes or significant palpitations. If he does feel some skipped beats, he is able to resolve them by performing a cardio workout. Now he can walk quickly up his driveway, take a deep breath and feel normal. This would previously cause him to feel palpitations/arrhythmia.  He denies any chest pain, shortness of breath, or peripheral edema. No lightheadedness, headaches, syncope, orthopnea, or PND.      Past Medical History:  Diagnosis Date   Arthritis    lower back   Asthma    Skin cancer 04/13/2016   left neck   Wears contact lenses     Past Surgical History:  Procedure Laterality Date   COLONOSCOPY     ETHMOIDECTOMY Bilateral 11/10/2016   Procedure: ETHMOIDECTOMY;  Surgeon: Clyde Canterbury, MD;  Location: [place];  Service: ENT;  Laterality: Bilateral;   FRONTAL SINUS EXPLORATION Bilateral 11/10/2016   Procedure: FRONTAL SINUS EXPLORATION;  Surgeon: Clyde Canterbury, MD;  Location: [place];  Service: ENT;   Laterality: Bilateral;   IMAGE GUIDED SINUS SURGERY N/A 11/10/2016   Procedure: IMAGE GUIDED SINUS SURGERY;  Surgeon: Clyde Canterbury, MD;  Location: [place];  Service: ENT;  Laterality: N/A;  GAVE DISK TO CECE 10-23   LEFT HEART CATH AND CORONARY ANGIOGRAPHY N/A 05/30/2020   Procedure: LEFT HEART CATH AND CORONARY ANGIOGRAPHY;  Surgeon: Minna Merritts, MD;  Location: [place] [facility];  Service: Cardiovascular;  Laterality: N/A;   MAXILLARY ANTROSTOMY Bilateral 11/10/2016   Procedure: MAXILLARY ANTROSTOMY WITH TISSUE REMOVAL;  Surgeon: Clyde Canterbury, MD;  Location: [place];  Service: ENT;  Laterality: Bilateral;   PVC ABLATION N/A 07/22/2020   Procedure: PVC ABLATION;  Surgeon: Vickie Epley, MD;  Location: [place] [facility];  Service: Cardiovascular;  Laterality: N/A;   SPHENOIDECTOMY Bilateral 11/10/2016   Procedure: SPHENOIDECTOMY;  Surgeon: Clyde Canterbury, MD;  Location: [place];  Service: ENT;  Laterality: Bilateral;    Current Medications: Current Meds  Medication Sig   acetaminophen (TYLENOL) 500 MG tablet Take 1,000 mg by mouth every 6 (six) hours as needed for moderate pain or mild pain. Every three days   albuterol (PROVENTIL HFA;VENTOLIN HFA) 108 (90 Base) MCG/ACT inhaler Inhale 2 puffs into the lungs every 6 (six) hours as needed for wheezing or shortness of breath.   aspirin 325 MG tablet Take 650 mg by mouth every 6 (six) hours as needed for moderate pain or mild pain (Back Pain). Every three days   Azelastine HCl  137 MCG/SPRAY SOLN    cyclobenzaprine (FLEXERIL) 5 MG tablet Take 5 mg by mouth 3 (three) times daily as needed (Back Spasm).   fluticasone (FLONASE) 50 MCG/ACT nasal spray Place 2 sprays into both nostrils 2 (two) times daily.   ibuprofen (ADVIL) 200 MG tablet Take 400 mg by mouth every 8 (eight) hours as needed for moderate pain or mild pain (Back pain).   magnesium oxide (MAG-OX) 400 MG tablet Take 1 tablet (400 mg total)  by mouth daily.   montelukast (SINGULAIR) 10 MG tablet Take 10 mg by mouth daily.   sildenafil (VIAGRA) 50 MG tablet Take 50 mg by mouth as needed for erectile dysfunction.   XHANCE 93 MCG/ACT EXHU Place 2 puffs into the nose 2 (two) times daily.   [DISCONTINUED] metoprolol succinate (TOPROL-XL) 50 MG 24 hr tablet Take 1 tablet (50 mg total) by mouth daily. Take with or immediately following a meal.   [DISCONTINUED] sotalol (BETAPACE) 120 MG tablet Take 1 tablet (120 mg total) by mouth every 12 (twelve) hours.     Allergies:   Black walnut flavor, Cholestatin, Pecan nut (diagnostic), and Quinolones   Social History   Socioeconomic History   Marital status: Married    Spouse name: Not on file   Number of children: Not on file   Years of education: Not on file   Highest education level: Not on file  Occupational History   Not on file  Tobacco Use   Smoking status: Former    Types: Cigarettes    Quit date: 2008    Years since quitting: 15.4   Smokeless tobacco: Never  Vaping Use   Vaping Use: Never used  Substance and Sexual Activity   Alcohol use: Yes    Alcohol/week: 14.0 standard drinks of alcohol    Types: 14 Cans of beer per week    Comment: 1-2 beers per days 5-6 days per week   Drug use: Never   Sexual activity: Not on file  Other Topics Concern   Not on file  Social History Narrative   Not on file   Social Determinants of Health   Financial Resource Strain: Not on file  Food Insecurity: Not on file  Transportation Needs: Not on file  Physical Activity: Not on file  Stress: Not on file  Social Connections: Not on file     Family History: The patient's family history includes Diabetes in his mother.  ROS:   Please see the history of present illness.    All other systems reviewed and are negative.  EKGs/Labs/Other Studies Reviewed:    The following studies were reviewed today:  October 10, 2020 ZIO personally reviewed HR 42 - 203 bpm. 1259 VT  episodes, longest lasting 56mnute at a rate of 145bpm. Patient triggered episodes correspond to VT episode. Rare supraventricular ectopy. Will schedule follow up appointment to discuss.  09/05/2020  Exercise Tolerance Test:   No ST deviation was noted.   Inconclusive ECG stress test for ischemia due to insufficient heart rate response. Drug-induced chronotropic incompetence (beta blocker, propafenone). Frequent RVOT PVCs/couplets/NSVT at baseline, resolve with activity and return in late recovery. No evidence for class I antiarrhythmic-related QRS broadening or pro-arrhythmia with exercise.  07/22/2020  PVC Ablation: CONCLUSIONS: 1. Sinus rhythm with very frequent PVCs upon presentation   2. Inducible ventricular tachycardia/frequent PVCs (monomorphic, steeply inferior axis with a precordial transition in V2, QS in lead I) 3.  Ablation at the left/right commissure and right ventricular outflow tract without acute  success 4. No early apparent complications. 5.  Plan to initiate flecainide 100 mg by mouth twice daily in addition to his metoprolol succinate given no evidence of coronary artery disease or scar on cardiac MRI.  Plan to see back in clinic in 7 to 10 days for repeat EKG on flecainide and metoprolol.   EKG:  EKG is personally reviewed.  06/26/2021: Sinus rhythm. No PVC's. QTc of 420 ms.  Recent Labs: 07/17/2020: Hemoglobin 14.7; Platelets 290 12/19/2020: BUN 14; Creatinine, Ser 0.80; Magnesium 2.1; Potassium 4.5; Sodium 139   Recent Lipid Panel No results found for: "CHOL", "TRIG", "HDL", "CHOLHDL", "VLDL", "LDLCALC", "LDLDIRECT"  Physical Exam:    VS:  BP 108/68   Pulse (!) 55   Ht '5\' 10"'$  (1.778 m)   Wt 206 lb (93.4 kg)   SpO2 97%   BMI 29.56 kg/m     Wt Readings from Last 3 Encounters:  06/26/21 206 lb (93.4 kg)  12/19/20 206 lb 6.4 oz (93.6 kg)  12/09/20 208 lb (94.3 kg)     GEN: Well nourished, well developed in no acute distress HEENT: Normal NECK: No JVD; No  carotid bruits LYMPHATICS: No lymphadenopathy CARDIAC: RRR, no murmurs, rubs, gallops RESPIRATORY:  Clear to auscultation without rales, wheezing or rhonchi  ABDOMEN: Soft, non-tender, non-distended MUSCULOSKELETAL:  No edema; No deformity  SKIN: Warm and dry NEUROLOGIC:  Alert and oriented x 3 PSYCHIATRIC:  Normal affect        ASSESSMENT:    1. VT (ventricular tachycardia) (HCC)   2. Near syncope   3. Encounter for long-term (current) use of high-risk medication    PLAN:    In order of problems listed above:  #VT #PVCs Doing well on sotalol 120 mg by mouth twice daily.  QTc acceptable for continued use.  Has very effectively suppressed his PVCs.  Follow-up with APP in 6 months. Follow-up with me in 1 year.   Medication Adjustments/Labs and Tests Ordered: Current medicines are reviewed at length with the patient today.  Concerns regarding medicines are outlined above.  Orders Placed This Encounter  Procedures   EKG 12-Lead   Meds ordered this encounter  Medications   sotalol (BETAPACE) 120 MG tablet    Sig: Take 1 tablet (120 mg total) by mouth every 12 (twelve) hours.    Dispense:  180 tablet    Refill:  3   metoprolol succinate (TOPROL-XL) 50 MG 24 hr tablet    Sig: Take 1 tablet (50 mg total) by mouth daily. Take with or immediately following a meal.    Dispense:  90 tablet    Refill:  3    I,Mathew Stumpf,acting as a scribe for Vickie Epley, MD.,have documented all relevant documentation on the behalf of Vickie Epley, MD,as directed by  Vickie Epley, MD while in the presence of Vickie Epley, MD.  I, Vickie Epley, MD, have reviewed all documentation for this visit. The documentation on 06/26/21 for the exam, diagnosis, procedures, and orders are all accurate and complete.   Signed, Lars Mage, MD, [facility], [facility] 06/26/2021 9:49 PM    Electrophysiology [facility]

## 2021-06-26 ENCOUNTER — Encounter: Payer: Self-pay | Admitting: Cardiology

## 2021-06-26 ENCOUNTER — Ambulatory Visit (INDEPENDENT_AMBULATORY_CARE_PROVIDER_SITE_OTHER): Payer: No Typology Code available for payment source | Admitting: Cardiology

## 2021-06-26 VITALS — BP 108/68 | HR 55 | Ht 70.0 in | Wt 206.0 lb

## 2021-06-26 DIAGNOSIS — Z79899 Other long term (current) drug therapy: Secondary | ICD-10-CM | POA: Diagnosis not present

## 2021-06-26 DIAGNOSIS — R55 Syncope and collapse: Secondary | ICD-10-CM | POA: Diagnosis not present

## 2021-06-26 DIAGNOSIS — I472 Ventricular tachycardia, unspecified: Secondary | ICD-10-CM | POA: Diagnosis not present

## 2021-06-26 MED ORDER — SOTALOL HCL 120 MG PO TABS: 120.0000 mg | ORAL_TABLET | Freq: Two times a day (BID) | ORAL | 3 refills | Status: DC

## 2021-06-26 MED ORDER — METOPROLOL SUCCINATE ER 50 MG PO TB24: 50.0000 mg | ORAL_TABLET | Freq: Every day | ORAL | 3 refills | Status: DC

## 2021-06-26 NOTE — Patient Instructions (Addendum)
Medication Instructions:  Your physician recommends that you continue on your current medications as directed. Please refer to the Current Medication list given to you today. *If you need a refill on your cardiac medications before your next appointment, please call your pharmacy*  Lab Work: None. If you have labs (blood work) drawn today and your tests are completely normal, you will receive your results only by: [place] (if you have MyChart) OR A paper copy in the mail If you have any lab test that is abnormal or we need to change your treatment, we will call you to review the results.  Testing/Procedures: None.  Follow-Up: At [facility], you and your health needs are our priority.  As part of our continuing mission to provide you with exceptional heart care, we have created designated Provider Care Teams.  These Care Teams include your primary Cardiologist (physician) and Advanced Practice Providers (APPs -  Physician Assistants and Nurse Practitioners) who all work together to provide you with the care you need, when you need it.  Your physician wants you to follow-up in: 6 months one of the following Advanced Practice Providers on your designated Care Team:    Tommye Standard, [place] Legrand Como "Jonni Sanger" [place], [place]  12 months with Lars Mage, MD   We recommend signing up for the patient portal called "MyChart".  Sign up information is provided on this After Visit Summary.  MyChart is used to connect with patients for Virtual Visits (Telemedicine).  Patients are able to view lab/test results, encounter notes, upcoming appointments, etc.  Non-urgent messages can be sent to your provider as well.   To learn more about what you can do with MyChart, go to [email or web address].    Any Other Special Instructions Will Be Listed Below (If Applicable).

## 2021-12-11 ENCOUNTER — Ambulatory Visit: Payer: No Typology Code available for payment source | Admitting: Physician Assistant

## 2021-12-21 ENCOUNTER — Other Ambulatory Visit (HOSPITAL_COMMUNITY): Payer: Self-pay | Admitting: Cardiology

## 2022-01-28 NOTE — Progress Notes (Signed)
Cardiology Office Note Date:  01/29/2022  Patient ID:  Khylin, Kyle Kim 01-05-1961, MRN [id number] PCP:  Maryland Pink, MD  Cardiologist:  Ida Rogue, MD Electrophysiologist: Vickie Epley, MD    Chief Complaint: 6 month Sotalol follow up  History of Present Illness: Kyle Kim is a 62 y.o. male with PMH notable for VT, PVCs; seen today for Vickie Epley, MD for routine electrophysiology followup.   S/p PVC ablation 07/22/2020 with delayed success, but then PVCs returned (less often) a couple weeks later. Flecainide started at that time, was ineffective, so switched to propafenone 08/20/2020. Zio on 10/02/20 showed continued high PVC burden, propafenone stopped 10/5 and offered sotalol and amio. Patient is S/p sotalol loading admission 11/2020.  He last saw Dr. Quentin Ore 06/2021, no significant palpitations, good exercise tolerance.  Since last appointment, he felt very well until 2-3 weeks ago. He had two episodes of palpitations and tachycardia while working out. He thinks it may have been triggered by holiday stress or drinking coffee prior to working out. He has not had any episodes since that time. He is overall happy with his level of control.   Takes sotalol BID and toprol daily. Uses a pill box to keep medication organized.    he denies chest pain, dyspnea, PND, orthopnea, nausea, vomiting, dizziness, syncope, edema, weight gain, or early satiety.     Past Medical History:  Diagnosis Date   Arthritis    lower back   Asthma    Skin cancer 04/13/2016   left neck   Wears contact lenses     Past Surgical History:  Procedure Laterality Date   COLONOSCOPY     ETHMOIDECTOMY Bilateral 11/10/2016   Procedure: ETHMOIDECTOMY;  Surgeon: Clyde Canterbury, MD;  Location: [place];  Service: ENT;  Laterality: Bilateral;   FRONTAL SINUS EXPLORATION Bilateral 11/10/2016   Procedure: FRONTAL SINUS EXPLORATION;  Surgeon: Clyde Canterbury, MD;  Location: [place];  Service: ENT;  Laterality: Bilateral;   IMAGE GUIDED SINUS SURGERY N/A 11/10/2016   Procedure: IMAGE GUIDED SINUS SURGERY;  Surgeon: Clyde Canterbury, MD;  Location: [place];  Service: ENT;  Laterality: N/A;  GAVE DISK TO CECE 10-23   LEFT HEART CATH AND CORONARY ANGIOGRAPHY N/A 05/30/2020   Procedure: LEFT HEART CATH AND CORONARY ANGIOGRAPHY;  Surgeon: Minna Merritts, MD;  Location: [place] [facility];  Service: Cardiovascular;  Laterality: N/A;   MAXILLARY ANTROSTOMY Bilateral 11/10/2016   Procedure: MAXILLARY ANTROSTOMY WITH TISSUE REMOVAL;  Surgeon: Clyde Canterbury, MD;  Location: [place];  Service: ENT;  Laterality: Bilateral;   PVC ABLATION N/A 07/22/2020   Procedure: PVC ABLATION;  Surgeon: Vickie Epley, MD;  Location: [place] [facility];  Service: Cardiovascular;  Laterality: N/A;   SPHENOIDECTOMY Bilateral 11/10/2016   Procedure: SPHENOIDECTOMY;  Surgeon: Clyde Canterbury, MD;  Location: [place];  Service: ENT;  Laterality: Bilateral;    Current Outpatient Medications  Medication Instructions   acetaminophen (TYLENOL) 1,000 mg, Oral, Every 6 hours PRN, Every three days   albuterol (PROVENTIL HFA;VENTOLIN HFA) 108 (90 Base) MCG/ACT inhaler 2 puffs, Inhalation, Every 6 hours PRN   aspirin 650 mg, Oral, Every 6 hours PRN, Every three days   Azelastine HCl 137 MCG/SPRAY SOLN    cyclobenzaprine (FLEXERIL) 5 mg, Oral, 3 times daily PRN   fluticasone (FLONASE) 50 MCG/ACT nasal spray 2 sprays, Each Nare, 2 times daily   ibuprofen (ADVIL) 400 mg, Oral, Every 8 hours PRN   magnesium  oxide (MAG-OX) 400 mg, Oral, Daily   metoprolol succinate (TOPROL-XL) 50 mg, Oral, Daily, Take with or immediately following a meal.   montelukast (SINGULAIR) 10 mg, Oral, Daily   sildenafil (VIAGRA) 50 mg, Oral, As needed   sotalol (BETAPACE) 120 mg, Oral, Every 12 hours   XHANCE 93 MCG/ACT EXHU 2 puffs, Nasal, 2 times daily    Social History:  The patient  reports  that he quit smoking about 16 years ago. His smoking use included cigarettes. He has never used smokeless tobacco. He reports current alcohol use of about 14.0 standard drinks of alcohol per week. He reports that he does not use drugs.   ROS:  Please see the history of present illness. All other systems are reviewed and otherwise negative.   PHYSICAL EXAM:  VS:  BP 110/72   Pulse 62   Ht '5\' 10"'$  (1.778 m)   Wt 211 lb 6.4 oz (95.9 kg)   SpO2 95%   BMI 30.33 kg/m  BMI: Body mass index is 30.33 kg/m.  GEN- The patient is well appearing, alert and oriented x 3 today.   HEENT: normocephalic, atraumatic; sclera clear, conjunctiva pink; hearing intact; oropharynx clear; neck supple, no JVP Lungs- Clear to ausculation bilaterally, normal work of breathing.  No wheezes, rales, rhonchi Heart- Regular rate and rhythm, no murmurs, rubs or gallops, PMI not laterally displaced GI- soft, non-tender, non-distended, bowel sounds present, no hepatosplenomegaly Extremities- No peripheral edema. no clubbing or cyanosis; DP/PT/radial pulses 2+ bilaterally MS- no significant deformity or atrophy Skin- warm and dry, no rash or lesion Psych- euthymic mood, full affect Neuro- strength and sensation are intact   EKG is ordered. Personal review of EKG from today shows:  NSR, rate 62; By my measurement, QT is 475m, Qtc 4263m Recent Labs: No results found for requested labs within last 365 days.  No results found for requested labs within last 365 days.   CrCl cannot be calculated (Patient's most recent lab result is older than the maximum 21 days allowed.).   Wt Readings from Last 3 Encounters:  01/29/22 211 lb 6.4 oz (95.9 kg)  06/26/21 206 lb (93.4 kg)  12/19/20 206 lb 6.4 oz (93.6 kg)     Additional studies reviewed include: Previous EP, cardiology notes.   Long-term Monitoring, 10/10/2020 HR 42 - 203 bpm. 1259 VT episodes, longest lasting 12m38mte at a rate of 145bpm. Patient triggered episodes  correspond to VT episode. Rare supraventricular ectopy. Will schedule follow up appointment to discuss.  PVC Ablation 07/22/2020 CONCLUSIONS: 1. Sinus rhythm with very frequent PVCs upon presentation   2. Inducible ventricular tachycardia/frequent PVCs (monomorphic, steeply inferior axis with a precordial transition in V2, QS in lead I) 3.  Ablation at the left/right commissure and right ventricular outflow tract without acute success 4. No early apparent complications. 5.  Plan to initiate flecainide 100 mg by mouth twice daily in addition to his metoprolol succinate given no evidence of coronary artery disease or scar on cardiac MRI.  Plan to see back in clinic in 7 to 10 days for repeat EKG on flecainide and metoprolol.   ASSESSMENT AND PLAN:  #) PVC Sotalol + toprol XL, continued at current doses QT/QTc stable from prior EKGs Updated labs today We discussed keeping symptom diary to see if episodes correlate with specific activity / caffeine intake. We also discussed switching to 1/2 caf or decaf coffee He requests 90d refills of medications   Current medicines are reviewed at length with the  patient today.   The patient has concerns regarding his medicines.  The following changes were made today:  none  Labs/ tests ordered today include:  Orders Placed This Encounter  Procedures   Basic metabolic panel   Magnesium   EKG 12-Lead     Disposition: Follow up with Dr. Quentin Ore or EP APP in in 6 months Or sooner if palpitation episodes become more frequent   Signed Mamie Levers, NP  01/29/22  11:15 AM  Electrophysiology [facility]

## 2022-01-29 ENCOUNTER — Encounter: Payer: Self-pay | Admitting: Physician Assistant

## 2022-01-29 ENCOUNTER — Ambulatory Visit: Payer: No Typology Code available for payment source | Attending: Physician Assistant | Admitting: Cardiology

## 2022-01-29 VITALS — BP 110/72 | HR 62 | Ht 70.0 in | Wt 211.4 lb

## 2022-01-29 DIAGNOSIS — I472 Ventricular tachycardia, unspecified: Secondary | ICD-10-CM | POA: Diagnosis not present

## 2022-01-29 DIAGNOSIS — R55 Syncope and collapse: Secondary | ICD-10-CM

## 2022-01-29 DIAGNOSIS — Z79899 Other long term (current) drug therapy: Secondary | ICD-10-CM

## 2022-01-29 LAB — BASIC METABOLIC PANEL
BUN/Creatinine Ratio: 18 (ref 9–20)
BUN: 14 mg/dL (ref 6–24)
CO2: 24 mmol/L (ref 20–29)
Calcium: 9.7 mg/dL (ref 8.7–10.2)
Chloride: 101 mmol/L (ref 96–106)
Creatinine, Ser: 0.78 mg/dL (ref 0.76–1.27)
Glucose: 96 mg/dL (ref 70–99)
Potassium: 4.4 mmol/L (ref 3.5–5.2)
Sodium: 139 mmol/L (ref 134–144)
eGFR: 103 mL/min/{1.73_m2} (ref >59)

## 2022-01-29 LAB — MAGNESIUM: Magnesium: 2.1 mg/dL (ref 1.6–2.3)

## 2022-01-29 MED ORDER — MAGNESIUM OXIDE 400 MG PO TABS: 1.0000 | ORAL_TABLET | Freq: Every day | ORAL | 3 refills | Status: DC

## 2022-01-29 NOTE — Patient Instructions (Signed)
Medication Instructions:  Your physician recommends that you continue on your current medications as directed. Please refer to the Current Medication list given to you today.  *If you need a refill on your cardiac medications before your next appointment, please call your pharmacy*  Lab Work: TODAY: BMET and magnesium If you have labs (blood work) drawn today and your tests are completely normal, you will receive your results only by: [place] (if you have MyChart) OR A paper copy in the mail If you have any lab test that is abnormal or we need to change your treatment, we will call you to review the results.  Testing/Procedures: NONE  Follow-Up: At [facility], you and your health needs are our priority.  As part of our continuing mission to provide you with exceptional heart care, we have created designated Provider Care Teams.  These Care Teams include your primary Cardiologist (physician) and Advanced Practice Providers (APPs -  Physician Assistants and Nurse Practitioners) who all work together to provide you with the care you need, when you need it.  Your next appointment:   6 month(s) call in February to schedule your follow-up appointment.  Provider:   Lars Mage, MD or Mamie Levers, NP

## 2022-02-01 ENCOUNTER — Other Ambulatory Visit: Payer: Self-pay | Admitting: Student

## 2022-04-15 ENCOUNTER — Encounter: Payer: Self-pay | Admitting: Gastroenterology

## 2022-04-15 NOTE — H&P (Signed)
Pre-Procedure H&P   Patient ID: Kyle Kim is a 62 y.o. male.  Gastroenterology Provider: Annamaria Helling, DO  Referring Provider: Laurine Blazer, PA PCP: Maryland Pink, MD  Date: 04/16/2022  HPI Kyle Kim is a 62 y.o. male who presents today for Colonoscopy for FIT positive .  Patient FIT positive on September 10, 2021.  He reports some straining with bowel movements which Metamucil has helped.  He denies any melena or hematochezia.  He is on aspirin 325 every 3 days.  Has a history of V. tach on sotalol and metoprolol succinate for this reason.  Most recent hemoone 15.3 MCV 93.7 platelets 284,000 creatinine 0.78  Last colonoscopy October 2018 demonstrated pandiverticulosis and internal hemorrhoids Prior to that January 2013 [facility] colonoscopy pandiverticulosis and hemorrhoids   Past Medical History:  Diagnosis Date   Arthritis    lower back   Asthma    Skin cancer 04/13/2016   left neck   SVT (supraventricular tachycardia)    Wears contact lenses     Past Surgical History:  Procedure Laterality Date   CARDIAC CATHETERIZATION     COLONOSCOPY     COLONOSCOPY     x3   ETHMOIDECTOMY Bilateral 11/10/2016   Procedure: ETHMOIDECTOMY;  Surgeon: Clyde Canterbury, MD;  Location: [place];  Service: ENT;  Laterality: Bilateral;   FRONTAL SINUS EXPLORATION Bilateral 11/10/2016   Procedure: FRONTAL SINUS EXPLORATION;  Surgeon: Clyde Canterbury, MD;  Location: [place];  Service: ENT;  Laterality: Bilateral;   IMAGE GUIDED SINUS SURGERY N/A 11/10/2016   Procedure: IMAGE GUIDED SINUS SURGERY;  Surgeon: Clyde Canterbury, MD;  Location: [place];  Service: ENT;  Laterality: N/A;  GAVE DISK TO [facility] 10-23   LEFT HEART CATH AND CORONARY ANGIOGRAPHY N/A 05/30/2020   Procedure: LEFT HEART CATH AND CORONARY ANGIOGRAPHY;  Surgeon: Minna Merritts, MD;  Location: [place] [facility];  Service: Cardiovascular;  Laterality: N/A;   MAXILLARY ANTROSTOMY  Bilateral 11/10/2016   Procedure: MAXILLARY ANTROSTOMY WITH TISSUE REMOVAL;  Surgeon: Clyde Canterbury, MD;  Location: [place];  Service: ENT;  Laterality: Bilateral;   PVC ABLATION N/A 07/22/2020   Procedure: PVC ABLATION;  Surgeon: Vickie Epley, MD;  Location: [place] [facility];  Service: Cardiovascular;  Laterality: N/A;   SPHENOIDECTOMY Bilateral 11/10/2016   Procedure: SPHENOIDECTOMY;  Surgeon: Clyde Canterbury, MD;  Location: [place];  Service: ENT;  Laterality: Bilateral;    Family History Paternal grandfather-gastric cancer; possible colorectal cancer maternal grandfather No other h/o GI disease or malignancy  Review of Systems  Constitutional:  Negative for activity change, appetite change, chills, diaphoresis, fatigue, fever and unexpected weight change.  HENT:  Negative for trouble swallowing and voice change.   Respiratory:  Negative for shortness of breath and wheezing.   Cardiovascular:  Negative for chest pain, palpitations and leg swelling.  Gastrointestinal:  Positive for constipation. Negative for abdominal distention, abdominal pain, anal bleeding, blood in stool, diarrhea, nausea and vomiting.  Musculoskeletal:  Negative for arthralgias and myalgias.  Skin:  Negative for color change and pallor.  Neurological:  Negative for dizziness, syncope and weakness.  Psychiatric/Behavioral:  Negative for confusion. The patient is not nervous/anxious.   All other systems reviewed and are negative.    Medications No current facility-administered medications on file prior to encounter.   Current Outpatient Medications on File Prior to Encounter  Medication Sig Dispense Refill   metoprolol succinate (TOPROL-XL) 50 MG 24 hr tablet Take 1 tablet (  50 mg total) by mouth daily. Take with or immediately following a meal. 90 tablet 3   acetaminophen (TYLENOL) 500 MG tablet Take 1,000 mg by mouth every 6 (six) hours as needed for moderate pain or mild pain. Every  three days     albuterol (PROVENTIL HFA;VENTOLIN HFA) 108 (90 Base) MCG/ACT inhaler Inhale 2 puffs into the lungs every 6 (six) hours as needed for wheezing or shortness of breath.     aspirin 325 MG tablet Take 650 mg by mouth every 6 (six) hours as needed for moderate pain or mild pain (Back Pain). Every three days     Azelastine HCl 137 MCG/SPRAY SOLN      cyclobenzaprine (FLEXERIL) 5 MG tablet Take 5 mg by mouth 3 (three) times daily as needed (Back Spasm).     fluticasone (FLONASE) 50 MCG/ACT nasal spray Place 2 sprays into both nostrils 2 (two) times daily.     ibuprofen (ADVIL) 200 MG tablet Take 400 mg by mouth every 8 (eight) hours as needed for moderate pain or mild pain (Back pain).     montelukast (SINGULAIR) 10 MG tablet Take 10 mg by mouth daily.     sildenafil (VIAGRA) 50 MG tablet Take 50 mg by mouth as needed for erectile dysfunction.     XHANCE 93 MCG/ACT EXHU Place 2 puffs into the nose 2 (two) times daily.      Pertinent medications related to GI and procedure were reviewed by me with the patient prior to the procedure   Current Facility-Administered Medications:    0.9 %  sodium chloride infusion, , Intravenous, Continuous, Annamaria Helling, DO      Allergies  Allergen Reactions   [facility] Flavor Shortness Of Breath and Swelling   Cholestatin Shortness Of Breath and Swelling   Pecan Nut (Diagnostic) Shortness Of Breath    All tree nuts cause throat swelling and "asthma-like" reaction   Quinolones Other (See Comments)    Fluoroquinolones not recommended in patients with aortic root dilation   Allergies were reviewed by me prior to the procedure  Objective   Body mass index is 29.13 kg/m. Vitals:   04/16/22 0724  BP: (!) 122/90  Pulse: 70  Resp: 18  Temp: (!) 97 F (36.1 C)  TempSrc: Temporal  SpO2: 96%  Weight: 92.1 kg  Height: 5\' 10"  (1.778 m)     Physical Exam Vitals and nursing note reviewed.  Constitutional:      General: He is not  in acute distress.    Appearance: Normal appearance. He is not ill-appearing, toxic-appearing or diaphoretic.  HENT:     Head: Normocephalic and atraumatic.     Nose: Nose normal.     Mouth/Throat:     Mouth: Mucous membranes are moist.     Pharynx: Oropharynx is clear.  Eyes:     General: No scleral icterus.    Extraocular Movements: Extraocular movements intact.  Cardiovascular:     Rate and Rhythm: Normal rate and regular rhythm.     Heart sounds: Normal heart sounds. No murmur heard.    No friction rub. No gallop.  Pulmonary:     Effort: Pulmonary effort is normal. No respiratory distress.     Breath sounds: Normal breath sounds. No wheezing, rhonchi or rales.  Abdominal:     General: Bowel sounds are normal. There is no distension.     Palpations: Abdomen is soft.     Tenderness: There is no abdominal tenderness. There is no guarding  or rebound.  Musculoskeletal:     Cervical back: Neck supple.     Right lower leg: No edema.     Left lower leg: No edema.  Skin:    General: Skin is warm and dry.     Coloration: Skin is not jaundiced or pale.  Neurological:     General: No focal deficit present.     Mental Status: He is alert and oriented to person, place, and time. Mental status is at baseline.  Psychiatric:        Mood and Affect: Mood normal.        Behavior: Behavior normal.        Thought Content: Thought content normal.        Judgment: Judgment normal.      Assessment:  Kyle Kim is a 62 y.o. male  who presents today for Colonoscopy for FIT positive .  Plan:  Colonoscopy with possible intervention today  Colonoscopy with possible biopsy, control of bleeding, polypectomy, and interventions as necessary has been discussed with the patient/patient representative. Informed consent was obtained from the patient/patient representative after explaining the indication, nature, and risks of the procedure including but not limited to death, bleeding,  perforation, missed neoplasm/lesions, cardiorespiratory compromise, and reaction to medications. Opportunity for questions was given and appropriate answers were provided. Patient/patient representative has verbalized understanding is amenable to undergoing the procedure.   Annamaria Helling, DO  [facility] Gastroenter[facility]  Portions of the record may have been created with voice recognition software. Occasional wrong-word or 'sound-a-like' substitutions may have occurred due to the inherent limitations of voice recognition software.  Read the chart carefully and recognize, using context, where substitutions may have occurred.

## 2022-04-16 ENCOUNTER — Other Ambulatory Visit: Payer: Self-pay

## 2022-04-16 ENCOUNTER — Encounter
Admission: RE | Disposition: A | Discharge status: Home / Self Care | Payer: Self-pay | Source: Home / Self Care | Attending: Gastroenterology

## 2022-04-16 ENCOUNTER — Ambulatory Visit: Payer: No Typology Code available for payment source | Admitting: Anesthesiology

## 2022-04-16 ENCOUNTER — Encounter: Payer: Self-pay | Admitting: Gastroenterology

## 2022-04-16 ENCOUNTER — Ambulatory Visit
Admission: RE | Admit: 2022-04-16 | Discharge: 2022-04-16 | Disposition: A | Discharge status: Home / Self Care | Payer: No Typology Code available for payment source | Attending: Gastroenterology | Admitting: Gastroenterology

## 2022-04-16 DIAGNOSIS — R195 Other fecal abnormalities: Secondary | ICD-10-CM | POA: Insufficient documentation

## 2022-04-16 DIAGNOSIS — K5900 Constipation, unspecified: Secondary | ICD-10-CM | POA: Diagnosis not present

## 2022-04-16 DIAGNOSIS — K5730 Diverticulosis of large intestine without perforation or abscess without bleeding: Secondary | ICD-10-CM | POA: Insufficient documentation

## 2022-04-16 DIAGNOSIS — M1990 Unspecified osteoarthritis, unspecified site: Secondary | ICD-10-CM | POA: Diagnosis not present

## 2022-04-16 DIAGNOSIS — D123 Benign neoplasm of transverse colon: Secondary | ICD-10-CM | POA: Insufficient documentation

## 2022-04-16 DIAGNOSIS — D122 Benign neoplasm of ascending colon: Secondary | ICD-10-CM | POA: Diagnosis not present

## 2022-04-16 DIAGNOSIS — D125 Benign neoplasm of sigmoid colon: Secondary | ICD-10-CM | POA: Insufficient documentation

## 2022-04-16 DIAGNOSIS — Z7982 Long term (current) use of aspirin: Secondary | ICD-10-CM | POA: Diagnosis not present

## 2022-04-16 DIAGNOSIS — K640 First degree hemorrhoids: Secondary | ICD-10-CM | POA: Diagnosis not present

## 2022-04-16 DIAGNOSIS — Z87891 Personal history of nicotine dependence: Secondary | ICD-10-CM | POA: Insufficient documentation

## 2022-04-16 DIAGNOSIS — Z1211 Encounter for screening for malignant neoplasm of colon: Secondary | ICD-10-CM | POA: Insufficient documentation

## 2022-04-16 DIAGNOSIS — D124 Benign neoplasm of descending colon: Secondary | ICD-10-CM | POA: Insufficient documentation

## 2022-04-16 DIAGNOSIS — J45909 Unspecified asthma, uncomplicated: Secondary | ICD-10-CM | POA: Diagnosis not present

## 2022-04-16 HISTORY — DX: Supraventricular tachycardia, unspecified: I47.10

## 2022-04-16 HISTORY — PX: COLONOSCOPY WITH PROPOFOL: SHX5780

## 2022-04-16 SURGERY — COLONOSCOPY WITH PROPOFOL
Anesthesia: General

## 2022-04-16 MED ORDER — LIDOCAINE HCL (CARDIAC) PF 100 MG/5ML IV SOSY
PREFILLED_SYRINGE | INTRAVENOUS | Status: DC | PRN
  Administered 2022-04-16: 100 mg via INTRAVENOUS

## 2022-04-16 MED ORDER — PROPOFOL 1000 MG/100ML IV EMUL
INTRAVENOUS | Status: AC
  Filled 2022-04-16: qty 300

## 2022-04-16 MED ORDER — PROPOFOL 1000 MG/100ML IV EMUL
INTRAVENOUS | Status: AC
  Filled 2022-04-16: qty 100

## 2022-04-16 MED ORDER — PROPOFOL 500 MG/50ML IV EMUL
INTRAVENOUS | Status: DC | PRN
  Administered 2022-04-16: 165 ug/kg/min via INTRAVENOUS

## 2022-04-16 MED ORDER — PROPOFOL 10 MG/ML IV BOLUS
INTRAVENOUS | Status: DC | PRN
  Administered 2022-04-16: 10 mg via INTRAVENOUS
  Administered 2022-04-16: 70 mg via INTRAVENOUS
  Administered 2022-04-16: 20 mg via INTRAVENOUS

## 2022-04-16 MED ORDER — PHENYLEPHRINE 80 MCG/ML (10ML) SYRINGE FOR IV PUSH (FOR BLOOD PRESSURE SUPPORT)
PREFILLED_SYRINGE | INTRAVENOUS | Status: DC | PRN
  Administered 2022-04-16: 160 ug via INTRAVENOUS

## 2022-04-16 MED ORDER — SODIUM CHLORIDE 0.9 % IV SOLN: INTRAVENOUS | Status: DC

## 2022-04-16 NOTE — Anesthesia Preprocedure Evaluation (Signed)
Anesthesia Evaluation  Patient identified by MRN, date of birth, ID band Patient awake    Reviewed: Allergy & Precautions, NPO status , Patient's Chart, lab work & pertinent test results  History of Anesthesia Complications Negative for: history of anesthetic complications  Airway Mallampati: III  TM Distance: <3 FB Neck ROM: full    Dental  (+) Chipped   Pulmonary neg shortness of breath, asthma , former smoker   Pulmonary exam normal        Cardiovascular Exercise Tolerance: Good (-) angina Normal cardiovascular exam+ dysrhythmias      Neuro/Psych negative neurological ROS  negative psych ROS   GI/Hepatic negative GI ROS, Neg liver ROS,neg GERD  ,,  Endo/Other  negative endocrine ROS    Renal/GU negative Renal ROS  negative genitourinary   Musculoskeletal   Abdominal   Peds  Hematology negative hematology ROS (+)   Anesthesia Other Findings Past Medical History: No date: Arthritis     Comment:  lower back No date: Asthma 04/13/2016: Skin cancer     Comment:  left neck No date: Wears contact lenses  Past Surgical History: No date: CARDIAC CATHETERIZATION No date: COLONOSCOPY 11/10/2016: ETHMOIDECTOMY; Bilateral     Comment:  Procedure: ETHMOIDECTOMY;  Surgeon: Clyde Canterbury, MD;                Location: [facility];  Service: ENT;                Laterality: Bilateral; 11/10/2016: FRONTAL SINUS EXPLORATION; Bilateral     Comment:  Procedure: FRONTAL SINUS EXPLORATION;  Surgeon: Clyde Canterbury, MD;  Location: [place];  Service: ENT;               Laterality: Bilateral; 11/10/2016: IMAGE GUIDED SINUS SURGERY; N/A     Comment:  Procedure: IMAGE GUIDED SINUS SURGERY;  Surgeon:               Clyde Canterbury, MD;  Location: [place];                Service: ENT;  Laterality: N/A;  GAVE DISK TO [facility] 10-23 05/30/2020: LEFT HEART CATH AND CORONARY ANGIOGRAPHY; N/A      Comment:  Procedure: LEFT HEART CATH AND CORONARY ANGIOGRAPHY;                Surgeon: Minna Merritts, MD;  Location: [place]               CV LAB;  Service: Cardiovascular;  Laterality: N/A; 11/10/2016: MAXILLARY ANTROSTOMY; Bilateral     Comment:  Procedure: MAXILLARY ANTROSTOMY WITH TISSUE REMOVAL;                Surgeon: Clyde Canterbury, MD;  Location: [place];  Service: ENT;  Laterality: Bilateral; 07/22/2020: PVC ABLATION; N/A     Comment:  Procedure: PVC ABLATION;  Surgeon: Vickie Epley,               MD;  Location: [place] [facility];  Service:               Cardiovascular;  Laterality: N/A; 11/10/2016: SPHENOIDECTOMY; Bilateral     Comment:  Procedure: SPHENOIDECTOMY;  Surgeon: Clyde Canterbury, MD;               Location: [place];  Service: ENT;                Laterality: Bilateral;     Reproductive/Obstetrics negative OB ROS                             Anesthesia Physical Anesthesia Plan  ASA: 2  Anesthesia Plan: General   Post-op Pain Management:    Induction: Intravenous  PONV Risk Score and Plan: Propofol infusion and TIVA  Airway Management Planned: Natural Airway and Nasal Cannula  Additional Equipment:   Intra-op Plan:   Post-operative Plan:   Informed Consent: I have reviewed the patients History and Physical, chart, labs and discussed the procedure including the risks, benefits and alternatives for the proposed anesthesia with the patient or authorized representative who has indicated his/her understanding and acceptance.     Dental Advisory Given  Plan Discussed with: Anesthesiologist, CRNA and Surgeon  Anesthesia Plan Comments: (Patient consented for risks of anesthesia including but not limited to:  - adverse reactions to medications - risk of airway placement if required - damage to eyes, teeth, lips or other oral mucosa - nerve damage due to positioning  - sore throat  or hoarseness - Damage to heart, brain, nerves, lungs, other parts of body or loss of life  Patient voiced understanding.)       Anesthesia Quick Evaluation

## 2022-04-16 NOTE — Op Note (Signed)
[facility] Gastroenterology Patient Name: Kyle Kim Procedure Date: 04/16/2022 7:03 AM MRN: [id number] Account #: [id number] Date of Birth: 09/03/1960 Admit Type: Outpatient Age: 62 Room: [facility] ENDO ROOM 1 Gender: Male Note Status: Finalized Instrument Name: Colonoscope [id number] Procedure:             Colonoscopy Indications:           Positive fecal immunochemical test Providers:             Rueben Bash, DO Referring MD:          Irven Easterly. Kary Kos, MD (Referring MD) Medicines:             Monitored Anesthesia Care Complications:         No immediate complications. Estimated blood loss:                         Minimal. Procedure:             Pre-Anesthesia Assessment:                        - Prior to the procedure, a History and Physical was                         performed, and patient medications and allergies were                         reviewed. The patient is competent. The risks and                         benefits of the procedure and the sedation options and                         risks were discussed with the patient. All questions                         were answered and informed consent was obtained.                         Patient identification and proposed procedure were                         verified by the physician, the nurse, the anesthetist                         and the technician in the endoscopy suite. Mental                         Status Examination: alert and oriented. Airway                         Examination: normal oropharyngeal airway and neck                         mobility. Respiratory Examination: clear to                         auscultation. CV Examination: RRR, no murmurs, no S3  or S4. Prophylactic Antibiotics: The patient does not                         require prophylactic antibiotics. Prior                         Anticoagulants: The patient has taken no anticoagulant                          or antiplatelet agents. ASA Grade Assessment: II - A                         patient with mild systemic disease. After reviewing                         the risks and benefits, the patient was deemed in                         satisfactory condition to undergo the procedure. The                         anesthesia plan was to use monitored anesthesia care                         (MAC). Immediately prior to administration of                         medications, the patient was re-assessed for adequacy                         to receive sedatives. The heart rate, respiratory                         rate, oxygen saturations, blood pressure, adequacy of                         pulmonary ventilation, and response to care were                         monitored throughout the procedure. The physical                         status of the patient was re-assessed after the                         procedure.                        After obtaining informed consent, the colonoscope was                         passed under direct vision. Throughout the procedure,                         the patient's blood pressure, pulse, and oxygen                         saturations were monitored continuously. The  Colonoscope was introduced through the anus and                         advanced to the the terminal ileum, with                         identification of the appendiceal orifice and IC                         valve. The colonoscopy was performed without                         difficulty. The patient tolerated the procedure well.                         The quality of the bowel preparation was evaluated                         using the BBPS [facility] Bowel Preparation Scale) with                         scores of: Right Colon = 2 (minor amount of residual                         staining, small fragments of stool and/or opaque                         liquid, but mucosa  seen well), Transverse Colon = 2                         (minor amount of residual staining, small fragments of                         stool and/or opaque liquid, but mucosa seen well) and                         Left Colon = 3 (entire mucosa seen well with no                         residual staining, small fragments of stool or opaque                         liquid). The total BBPS score equals 7. The quality of                         the bowel preparation was good. The terminal ileum,                         ileocecal valve, appendiceal orifice, and rectum were                         photographed. Findings:      The perianal and digital rectal examinations were normal. Pertinent       negatives include normal sphincter tone.      The terminal ileum appeared normal. Estimated blood loss: none.      Retroflexion in the right colon was performed.  Multiple small-mouthed diverticula were found in the entire colon. Most       prevalent on the left Estimated blood loss: none.      Non-bleeding internal hemorrhoids were found during retroflexion. The       hemorrhoids were Grade I (internal hemorrhoids that do not prolapse).       Estimated blood loss: none.      Seven sessile polyps were found in the sigmoid colon (1), descending       colon (2), transverse colon (3) and ascending colon (1). The polyps were       1 to 2 mm in size. These polyps were removed with a jumbo cold forceps.       Resection and retrieval were complete. Estimated blood loss was minimal.      A 4 to 5 mm polyp was found in the sigmoid colon. The polyp was sessile.       The polyp was removed with a cold snare. Resection and retrieval were       complete. Estimated blood loss was minimal.      The exam was otherwise without abnormality on direct and retroflexion       views. Impression:            - The examined portion of the ileum was normal.                        - Diverticulosis in the entire examined  colon.                        - Non-bleeding internal hemorrhoids.                        - Seven 1 to 2 mm polyps in the sigmoid colon, in the                         descending colon, in the transverse colon and in the                         ascending colon, removed with a jumbo cold forceps.                         Resected and retrieved.                        - One 4 to 5 mm polyp in the sigmoid colon, removed                         with a cold snare. Resected and retrieved.                        - The examination was otherwise normal on direct and                         retroflexion views. Recommendation:        - Patient has a contact number available for                         emergencies. The signs and symptoms of potential  delayed complications were discussed with the patient.                         Return to normal activities tomorrow. Written                         discharge instructions were provided to the patient.                        - Discharge patient to home.                        - Resume previous diet.                        - Continue present medications.                        - No ibuprofen, naproxen, or other non-steroidal                         anti-inflammatory drugs for 5 days after polyp removal.                        - Await pathology results.                        - Repeat colonoscopy for surveillance based on                         pathology results.                        - Return to referring physician as previously                         scheduled.                        - The findings and recommendations were discussed with                         the patient. Procedure Code(s):     --- Professional ---                        [id number], Colonoscopy, flexible; with removal of                         tumor(s), polyp(s), or other lesion(s) by snare                         technique                        45380, 56,  Colonoscopy, flexible; with biopsy, single                         or multiple Diagnosis Code(s):     --- Professional ---                        K64.0, First degree hemorrhoids  D12.5, Benign neoplasm of sigmoid colon                        D12.4, Benign neoplasm of descending colon                        D12.3, Benign neoplasm of transverse colon (hepatic                         flexure or splenic flexure)                        D12.2, Benign neoplasm of ascending colon                        R19.5, Other fecal abnormalities                        K57.30, Diverticulosis of large intestine without                         perforation or abscess without bleeding CPT copyright 2022 American Medical Association. All rights reserved. The codes documented in this report are preliminary and upon coder review may  be revised to meet current compliance requirements. Attending Participation:      I personally performed the entire procedure. Volney American, DO Annamaria Helling DO, DO 04/16/2022 8:11:23 AM This report has been signed electronically. Number of Addenda: 0 Note Initiated On: 04/16/2022 7:03 AM Scope Withdrawal Time: 0 hours 18 minutes 30 seconds  Total Procedure Duration: 0 hours 22 minutes 54 seconds  Estimated Blood Loss:  Estimated blood loss was minimal.      [facility]

## 2022-04-16 NOTE — Transfer of Care (Signed)
Immediate Anesthesia Transfer of Care Note  Patient: Roland Lemmen  Procedure(s) Performed: COLONOSCOPY WITH PROPOFOL  Patient Location: Endoscopy Unit  Anesthesia Type:General    Level of Consciousness: drowsy and patient cooperative  Airway & Oxygen Therapy: Patient Spontanous Breathing and Patient connected to face mask oxygen  Post-op Assessment: Report given to RN and Post -op Vital signs reviewed and stable  Post vital signs: Reviewed and stable  Last Vitals:  Vitals Value Taken Time  BP 98/61 04/16/22 0805  Temp 36.1 C 04/16/22 0805  Pulse 67 04/16/22 0806  Resp 4 04/16/22 0806  SpO2 98 % 04/16/22 0806  Vitals shown include unvalidated device data.  Last Pain:  Vitals:   04/16/22 0805  TempSrc: Temporal  PainSc: Asleep         Complications: No notable events documented.

## 2022-04-16 NOTE — Anesthesia Procedure Notes (Signed)
Procedure Name: General with mask airway Date/Time: 04/16/2022 7:41 AM  Performed by: Kelton Pillar, CRNAPre-anesthesia Checklist: Patient identified, Emergency Drugs available, Suction available and Patient being monitored Patient Re-evaluated:Patient Re-evaluated prior to induction Oxygen Delivery Method: Simple face mask Induction Type: IV induction Placement Confirmation: positive ETCO2, CO2 detector and breath sounds checked- equal and bilateral Dental Injury: Teeth and Oropharynx as per pre-operative assessment

## 2022-04-16 NOTE — Interval H&P Note (Signed)
History and Physical Interval Note: Preprocedure H&P from 04/16/22  was reviewed and there was no interval change after seeing and examining the patient.  Written consent was obtained from the patient after discussion of risks, benefits, and alternatives. Patient has consented to proceed with Colonoscopy with possible intervention   04/16/2022 7:31 AM  Kyle Kim  has presented today for surgery, with the diagnosis of +FIT.  The various methods of treatment have been discussed with the patient and family. After consideration of risks, benefits and other options for treatment, the patient has consented to  Procedure(s): COLONOSCOPY WITH PROPOFOL (N/A) as a surgical intervention.  The patient's history has been reviewed, patient examined, no change in status, stable for surgery.  I have reviewed the patient's chart and labs.  Questions were answered to the patient's satisfaction.     Annamaria Helling

## 2022-04-16 NOTE — Anesthesia Postprocedure Evaluation (Signed)
Anesthesia Post Note  Patient: Kyle Kim  Procedure(s) Performed: COLONOSCOPY WITH PROPOFOL  Patient location during evaluation: Endoscopy Anesthesia Type: General Level of consciousness: awake and alert Pain management: pain level controlled Vital Signs Assessment: post-procedure vital signs reviewed and stable Respiratory status: spontaneous breathing, nonlabored ventilation, respiratory function stable and patient connected to nasal cannula oxygen Cardiovascular status: blood pressure returned to baseline and stable Postop Assessment: no apparent nausea or vomiting Anesthetic complications: no   No notable events documented.   Last Vitals:  Vitals:   04/16/22 0825 04/16/22 0835  BP:    Pulse: 88 74  Resp:    Temp:    SpO2: 99% 99%    Last Pain:  Vitals:   04/16/22 0835  TempSrc:   PainSc: 0-No pain                 Precious Haws [name]

## 2022-04-17 ENCOUNTER — Encounter: Payer: Self-pay | Admitting: Gastroenterology

## 2022-04-17 LAB — SURGICAL PATHOLOGY

## 2022-04-24 ENCOUNTER — Encounter: Payer: Self-pay | Admitting: Gastroenterology

## 2022-06-27 ENCOUNTER — Other Ambulatory Visit: Payer: Self-pay | Admitting: Cardiology

## 2022-07-09 ENCOUNTER — Ambulatory Visit: Payer: No Typology Code available for payment source | Attending: Cardiology | Admitting: Cardiology

## 2022-07-09 ENCOUNTER — Encounter: Payer: Self-pay | Admitting: Cardiology

## 2022-07-09 VITALS — BP 122/78 | HR 69 | Ht 70.0 in | Wt 210.2 lb

## 2022-07-09 DIAGNOSIS — I493 Ventricular premature depolarization: Secondary | ICD-10-CM

## 2022-07-09 DIAGNOSIS — I472 Ventricular tachycardia, unspecified: Secondary | ICD-10-CM

## 2022-07-09 DIAGNOSIS — Z79899 Other long term (current) drug therapy: Secondary | ICD-10-CM

## 2022-07-09 MED ORDER — PROPAFENONE HCL ER 225 MG PO CP12: 225.0000 mg | ORAL_CAPSULE | Freq: Two times a day (BID) | ORAL | 3 refills | Status: AC

## 2022-07-09 MED ORDER — METOPROLOL SUCCINATE ER 25 MG PO TB24: 25.0000 mg | ORAL_TABLET | Freq: Every day | ORAL | 3 refills | Status: DC

## 2022-07-09 NOTE — Patient Instructions (Addendum)
Medication Instructions:  Your physician has recommended you make the following change in your medication:  1) STOP taking sotalol  2) 48 hours after stopping sotalol - START taking propafenone (Rhythmol) 225 mg twice daily  3) DECREASE metoprolol to 25 mg once daily  *If you need a refill on your cardiac medications before your next appointment, please call your pharmacy*  Follow-Up: At [facility], you and your health needs are our priority.  As part of our continuing mission to provide you with exceptional heart care, we have created designated Provider Care Teams.  These Care Teams include your primary Cardiologist (physician) and Advanced Practice Providers (APPs -  Physician Assistants and Nurse Practitioners) who all work together to provide you with the care you need, when you need it.  Your next appointment:   EKG - nurse visit in 10 days  Follow up with Dr. Lalla Brothers in 6-8 weeks

## 2022-07-09 NOTE — Progress Notes (Signed)
Electrophysiology Office Follow up Visit Note:    Date:  07/09/2022   ID:  Kyle Kim, DOB 1960/09/02, MRN [id number]  PCP:  Jerl Mina, MD  [facility] Cardiologist:  Julien Nordmann, MD  [facility] Electrophysiologist:  Lanier Prude, MD    Interval History:    Kyle Kim is a 62 y.o. male who presents for a follow up visit.   I last saw the patient June 26, 2021 for his PVCs and VT.  He had a prior catheter ablation during which an epicardial focus was identified with a broad area of early activation in the left ventricular outflow tract.  There was little impact on the PVC frequency with catheter ablation in this region.  He was previously on flecainide and propafenone and was then transition to sotalol.  Sotalol seems to have not made a significant impact according to the patient with a gradual increase in the burden of his arrhythmia.  He is interested in changing back to propafenone which seem to work better for the patient.  He continues to be very active and exercises regularly.  No signs of heart failure.       Past medical, surgical, social and family history were reviewed.  ROS:   Please see the history of present illness.    All other systems reviewed and are negative.  EKGs/Labs/Other Studies Reviewed:    The following studies were reviewed today:   EKG Interpretation Date/Time:  Thursday July 09 2022 15:36:02 EDT Ventricular Rate:  69 PR Interval:  194 QRS Duration:  86 QT Interval:  412 QTC Calculation: 441 R Axis:   52  Text Interpretation: Sinus rhythm with frequent Premature ventricular complexes in a pattern of bigeminy Confirmed by Steffanie Dunn [phone]) on 07/09/2022 7:59:21 PM    Physical Exam:    VS:  BP 122/78   Pulse 69   Ht 5\' 10"  (1.778 m)   Wt 210 lb 3.2 oz (95.3 kg)   SpO2 96%   BMI 30.16 kg/m     Wt Readings from Last 3 Encounters:  07/09/22 210 lb 3.2 oz (95.3 kg)  04/16/22 203 lb (92.1 kg)  01/29/22 211 lb  6.4 oz (95.9 kg)     GEN:  Well nourished, well developed in no acute distress CARDIAC: RRR, no murmurs, rubs, gallops RESPIRATORY:  Clear to auscultation without rales, wheezing or rhonchi       ASSESSMENT:    1. Ventricular tachycardia (HCC)   2. PVC's (premature ventricular contractions)   3. Encounter for long-term (current) use of high-risk medication    PLAN:    In order of problems listed above:  #PVCs #VT Had a prior PVC/VT ablation on July 22, 2020 that was not successful.  Focus of PVCs seem to be epicardial outside the left ventricular outflow tract.  Continues to have frequent PVCs that are symptomatic.  He tells me that he was doing better on propafenone and is interested in transitioning back to this medication.  I did discuss this with pharmacy and they have recommended stopping the sotalol.  After 48 hours he can start taking propafenone 225 mg by mouth twice daily.  I will decrease his metoprolol to 25 mg by mouth once daily.  He will follow-up in 10 days with a EKG/nurse visit.  I did discuss referring the patient for possible epicardial PVC/VT ablation should not be effective.  Follow-up 6 to 8 weeks with me.    Signed, Steffanie Dunn, MD, [facility] 07/09/2022 8:03  PM    Electrophysiology [facility]

## 2022-07-23 ENCOUNTER — Encounter: Payer: Self-pay | Admitting: Family Medicine

## 2022-07-23 ENCOUNTER — Ambulatory Visit: Payer: No Typology Code available for payment source

## 2022-07-23 ENCOUNTER — Ambulatory Visit (INDEPENDENT_AMBULATORY_CARE_PROVIDER_SITE_OTHER): Payer: No Typology Code available for payment source | Admitting: Family Medicine

## 2022-07-23 VITALS — BP 116/87 | HR 71 | Temp 97.7°F | Ht 70.0 in | Wt 211.0 lb

## 2022-07-23 VITALS — BP 118/78 | HR 62 | Resp 16 | Ht 70.0 in | Wt 210.4 lb

## 2022-07-23 DIAGNOSIS — M25511 Pain in right shoulder: Secondary | ICD-10-CM

## 2022-07-23 DIAGNOSIS — G8929 Other chronic pain: Secondary | ICD-10-CM

## 2022-07-23 DIAGNOSIS — J4520 Mild intermittent asthma, uncomplicated: Secondary | ICD-10-CM | POA: Diagnosis not present

## 2022-07-23 DIAGNOSIS — Z5320 Procedure and treatment not carried out because of patient's decision for unspecified reasons: Secondary | ICD-10-CM

## 2022-07-23 DIAGNOSIS — M545 Low back pain, unspecified: Secondary | ICD-10-CM | POA: Diagnosis not present

## 2022-07-23 DIAGNOSIS — I472 Ventricular tachycardia, unspecified: Secondary | ICD-10-CM | POA: Diagnosis not present

## 2022-07-23 DIAGNOSIS — M25512 Pain in left shoulder: Secondary | ICD-10-CM

## 2022-07-23 DIAGNOSIS — Z7689 Persons encountering health services in other specified circumstances: Secondary | ICD-10-CM | POA: Diagnosis not present

## 2022-07-23 DIAGNOSIS — I209 Angina pectoris, unspecified: Secondary | ICD-10-CM

## 2022-07-23 DIAGNOSIS — H6121 Impacted cerumen, right ear: Secondary | ICD-10-CM

## 2022-07-23 DIAGNOSIS — M25561 Pain in right knee: Secondary | ICD-10-CM

## 2022-07-23 DIAGNOSIS — Z0000 Encounter for general adult medical examination without abnormal findings: Secondary | ICD-10-CM | POA: Diagnosis not present

## 2022-07-23 DIAGNOSIS — M25562 Pain in left knee: Secondary | ICD-10-CM

## 2022-07-23 MED ORDER — CYCLOBENZAPRINE HCL 5 MG PO TABS
5.0000 mg | ORAL_TABLET | Freq: Three times a day (TID) | ORAL | 1 refills | Status: AC | PRN
Start: 2022-07-23 — End: ?

## 2022-07-23 NOTE — Patient Instructions (Addendum)
Check whether insurance will cover Tdap and Shingles vaccine and/or where you can get it for them to cover it

## 2022-07-23 NOTE — Progress Notes (Signed)
New patient visit   Patient: Kyle Kim   DOB: 1960-08-07   62 y.o. Male  MRN: [id number] Visit Date: 07/23/2022  Today's healthcare provider: Sherlyn Hay, DO   Chief Complaint  Patient presents with   New Patient (Initial Visit)   Subjective    Kyle Kim is a 62 y.o. male who presents today as a new patient to establish care.  HPI  Patient was seeing Dr. Jerl Mina, but he hadn't seen him in five years or so. It was too hard ot get an appointment (last itme he tried, it was 10-11 months out).  - Started going to [facility] on [address]; has been seeing NP there but he can't remember her name).  Chronic ventricular tachycardia:  - has PVC/VT morphology -> radioablation 07/2020  - Cardiology workup in 2022 included: Normal echocardiogram, event monitor with frequent short runs of nonsustained VT and high PVC burden, exercise treadmill study with VT noted.  - Cath done May 2022 Back on propafenone now, along with magnesium and metoprolol XL; tried sotalol and one other medication he cannot remember which were not effective.  Low back pain:   - Alternates between aspirin, ibuprofen and acetaminophen prior to work shifts.   - Occasionally (very rarely) takes Flexeril.   - He was told he had arthirtis in his 30s, as well as recently after injuring his back in 2019.  Allergies: Fluticasone and montelukast nightly; occasionally uses azelastine if fluticasone is not helping  - Has both Flonase and Xhance (which is also fluticasone propionate but which comes at a higher dose and has a different delivery mechanism which patient feels is more effective)  Exercise: Doing some of wife's pilates stretches for back every morning before work, along with some jumping jacks and push-ups. Goes for walks with some jogging in between  Left knee injury: had left ACL stretch with "crunching" of cartilage medially. Did PT with relief. Also has trouble with his right knee and  shoulders bilaterally.  Labs: Ate two pieces toast, cornflakes, juice and milk this morning, so he will be returning for his blood draw another day.  Patient recently had his colonoscopy 04/16/2022.  No recommendations for when repeat colonoscopy will be needed are available at this time.  Given presence of multiple polyps, though benign, I suspect it will be 5 years.     Past Medical History:  Diagnosis Date   Allergy    Arthritis    lower back   Asthma    Colon polyp    tubular adenoma   Skin cancer 04/13/2016   left neck   SVT (supraventricular tachycardia)    Wears contact lenses    Past Surgical History:  Procedure Laterality Date   CARDIAC CATHETERIZATION     COLONOSCOPY     COLONOSCOPY     x3   COLONOSCOPY WITH PROPOFOL N/A 04/16/2022   Procedure: COLONOSCOPY WITH PROPOFOL;  Surgeon: Jaynie Collins, DO;  Location: [facility] ENDOSCOPY;  Service: Gastroenterology;  Laterality: N/A;   ETHMOIDECTOMY Bilateral 11/10/2016   Procedure: ETHMOIDECTOMY;  Surgeon: Geanie Logan, MD;  Location: [facility] SURGERY CNTR;  Service: ENT;  Laterality: Bilateral;   FRONTAL SINUS EXPLORATION Bilateral 11/10/2016   Procedure: FRONTAL SINUS EXPLORATION;  Surgeon: Geanie Logan, MD;  Location: [facility] SURGERY CNTR;  Service: ENT;  Laterality: Bilateral;   IMAGE GUIDED SINUS SURGERY N/A 11/10/2016   Procedure: IMAGE GUIDED SINUS SURGERY;  Surgeon: Geanie Logan, MD;  Location: [facility] SURGERY [facility];  Service: ENT;  Laterality: N/A;  GAVE DISK TO [facility] 10-23   LEFT HEART CATH AND CORONARY ANGIOGRAPHY N/A 05/30/2020   Procedure: LEFT HEART CATH AND CORONARY ANGIOGRAPHY;  Surgeon: Antonieta Iba, MD;  Location: [facility] INVASIVE CV LAB;  Service: Cardiovascular;  Laterality: N/A;   MAXILLARY ANTROSTOMY Bilateral 11/10/2016   Procedure: MAXILLARY ANTROSTOMY WITH TISSUE REMOVAL;  Surgeon: Geanie Logan, MD;  Location: [facility] SUR[facility]ERY [facility];  Service: ENT;  Laterality: Bilateral;   PVC ABLATION N/A 07/22/2020    Procedure: PVC ABLATION;  Surgeon: Lanier Prude, MD;  Location: [facility] INVASIVE CV LAB;  Service: Cardiovascular;  Laterality: N/A;   SPHENOIDECTOMY Bilateral 11/10/2016   Procedure: SPHENOIDECTOMY;  Surgeon: Geanie Logan, MD;  Location: [facility] SUR[facility]ERY [facility];  Service: ENT;  Laterality: Bilateral;   Family Status  Relation Name Status   Mother  Alive   Father  Deceased   Sister  (Not Specified)   Brother  (Not Specified)   Mat Uncle  (Not Specified)   Oneal Grout  (Not Specified)   MGF  (Not Specified)   PGF  (Not Specified)  No partnership data on file   Family History  Problem Relation Age of Onset   Cancer Mother        breast   Diabetes Mother    Cancer Father        blood   Diabetes Sister    Diabetes Brother    Heart disease Maternal Uncle    Heart disease Paternal Uncle    Cancer Maternal Grandfather        colon   Cancer Paternal Grandfather        stomach   Social History   Socioeconomic History   Marital status: Married    Spouse name: Not on file   Number of children: Not on file   Years of education: Not on file   Highest education level: Master's degree (e.g., MA, MS, MEng, MEd, MSW, MBA)  Occupational History   Not on file  Tobacco Use   Smoking status: Former    Current packs/day: 0.00    Types: Cigarettes    Quit date: 2008    Years since quitting: 16.5   Smokeless tobacco: Never  Vaping Use   Vaping status: Never Used  Substance and Sexual Activity   Alcohol use: Yes    Alcohol/week: 6.0 standard drinks of alcohol    Types: 6 Cans of beer per week    Comment: 1-2 beers per days 5-6 days per week,none last 24hrs   Drug use: Never   Sexual activity: Yes  Other Topics Concern   Not on file  Social History Narrative   Not on file   Social Determinants of Health   Financial Resource Strain: Low Risk  (07/20/2022)   Overall Financial Resource Strain (CARDIA)    Difficulty of Paying Living Expenses: Not hard at all  Food Insecurity: No  Food Insecurity (07/20/2022)   Hunger Vital Sign    Worried About Running Out of Food in the Last Year: Never true    Ran Out of Food in the Last Year: Never true  Transportation Needs: No Transportation Needs (07/20/2022)   PRAPARE - Administrator, Civil Service (Medical): No    Lack of Transportation (Non-Medical): No  Physical Activity: Insufficiently Active (07/20/2022)   Exercise Vital Sign    Days of Exercise per Week: 5 days    Minutes of Exercise per Session: 10 min  Stress: No Stress Concern Present (07/20/2022)  [employer] of Occupational Health - Occupational Stress Questionnaire    Feeling of Stress : Not at all  Social Connections: Moderately Isolated (07/20/2022)   Social Connection and Isolation Panel [NHANES]    Frequency of Communication with Friends and Family: Twice a week    Frequency of Social Gatherings with Friends and Family: Once a week    Attends Religious Services: Never    Database administrator or Organizations: No    Attends Engineer, structural: Not on file    Marital Status: Married   Outpatient Medications Prior to Visit  Medication Sig   acetaminophen (TYLENOL) 500 MG tablet Take 1,000 mg by mouth every 6 (six) hours as needed for moderate pain or mild pain. Every three days   albuterol (PROVENTIL HFA;VENTOLIN HFA) 108 (90 Base) MCG/ACT inhaler Inhale 2 puffs into the lungs every 6 (six) hours as needed for wheezing or shortness of breath.   aspirin 325 MG tablet Take 650 mg by mouth every 6 (six) hours as needed for moderate pain or mild pain (Back Pain). Every three days   Azelastine HCl 137 MCG/SPRAY SOLN    fluticasone (FLONASE) 50 MCG/ACT nasal spray Place 2 sprays into both nostrils 2 (two) times daily.   ibuprofen (ADVIL) 200 MG tablet Take 400 mg by mouth every 8 (eight) hours as needed for moderate pain or mild pain (Back pain).   magnesium oxide (MAG-OX) 400 MG tablet Take 1 tablet (400 mg total) by mouth daily.    metoprolol succinate (TOPROL XL) 25 MG 24 hr tablet Take 1 tablet (25 mg total) by mouth daily.   montelukast (SINGULAIR) 10 MG tablet Take 10 mg by mouth daily.   propafenone (RYTHMOL SR) 225 MG 12 hr capsule Take 1 capsule (225 mg total) by mouth 2 (two) times daily.   sildenafil (VIAGRA) 50 MG tablet Take 50 mg by mouth as needed for erectile dysfunction.   XHANCE 93 MCG/ACT EXHU Place 2 puffs into the nose 2 (two) times daily.   [DISCONTINUED] cyclobenzaprine (FLEXERIL) 5 MG tablet Take 5 mg by mouth 3 (three) times daily as needed (Back Spasm).   No facility-administered medications prior to visit.   Allergies  Allergen Reactions   Black Walnut Flavor Shortness Of Breath and Swelling   Cholestatin Shortness Of Breath and Swelling   Pecan Nut (Diagnostic) Shortness Of Breath    All tree nuts cause throat swelling and "asthma-like" reaction   Quinolones Other (See Comments)    Fluoroquinolones not recommended in patients with aortic root dilation    Immunization History  Administered Date(s) Administered   Influenza,inj,Quad PF,6+ Mos 12/12/2020    Health Maintenance  Topic Date Due   HIV Screening  Never done   Hepatitis C Screening  Never done   DTaP/Tdap/Td (1 - Tdap) Never done   Zoster Vaccines- Shingrix (1 of 2) Never done   COVID-19 Vaccine (1) 08/08/2022 (Originally 10/30/1965)   INFLUENZA VACCINE  08/13/2022   Colonoscopy  04/15/2032   HPV VACCINES  Aged Out    Patient Care Team: Jerl Mina, MD as PCP - General (Family Medicine) Mariah Milling, Tollie Pizza, MD as PCP - Cardiology (Cardiology) Lanier Prude, MD as PCP - Electrophysiology (Cardiology)  Review of Systems  Constitutional:  Negative for appetite change, chills, fatigue and fever.  HENT:  Positive for sneezing. Negative for congestion, ear pain, hearing loss, nosebleeds and trouble swallowing.   Eyes:  Negative for pain and visual disturbance.  Respiratory:  Negative for  cough, chest tightness and  shortness of breath.   Cardiovascular:  Positive for palpitations. Negative for chest pain and leg swelling.       +occasional pressure/flip-flop sensation with PVCS  Gastrointestinal:  Negative for abdominal pain, blood in stool, constipation, diarrhea, nausea and vomiting.  Endocrine: Negative for polydipsia, polyphagia and polyuria.  Genitourinary:  Negative for difficulty urinating, dysuria and flank pain.  Musculoskeletal:  Positive for arthralgias (bilateral knees and shoulders) and back pain. Negative for joint swelling, myalgias and neck stiffness.  Skin:  Negative for color change, rash and wound.  Neurological:  Negative for dizziness, tremors, seizures, speech difficulty, weakness, light-headedness and headaches.  Psychiatric/Behavioral:  Negative for behavioral problems, confusion, decreased concentration, dysphoric mood and sleep disturbance. The patient is not nervous/anxious.   All other systems reviewed and are negative.      Objective    BP 116/87 (BP Location: Left Arm, Patient Position: Sitting, Cuff Size: Normal)   Pulse 71   Temp 97.7 F (36.5 C) (Oral)   Ht 5\' 10"  (1.778 m)   Wt 211 lb (95.7 kg)   SpO2 96%   BMI 30.28 kg/m    Physical Exam Vitals reviewed.  Constitutional:      General: He is not in acute distress.    Appearance: Normal appearance. He is well-developed. He is not diaphoretic.  HENT:     Head: Normocephalic and atraumatic.     Right Ear: Ear canal and external ear normal. There is impacted cerumen.     Left Ear: Tympanic membrane, ear canal and external ear normal.     Nose: Rhinorrhea present.     Right Turbinates: Enlarged (mild).     Left Turbinates: Enlarged (mild).     Mouth/Throat:     Mouth: Mucous membranes are moist.     Pharynx: Oropharynx is clear. No oropharyngeal exudate.  Eyes:     General: No scleral icterus.    Conjunctiva/sclera: Conjunctivae normal.     Pupils: Pupils are equal, round, and reactive to light.  Neck:      Thyroid: No thyromegaly.  Cardiovascular:     Rate and Rhythm: Normal rate and regular rhythm.     Pulses: Normal pulses.     Heart sounds: Normal heart sounds. No murmur heard. Pulmonary:     Effort: Pulmonary effort is normal. No respiratory distress.     Breath sounds: Normal breath sounds. No wheezing or rales.  Abdominal:     General: There is no distension.     Palpations: Abdomen is soft.     Tenderness: There is no abdominal tenderness.  Musculoskeletal:        General: No deformity.     Cervical back: Neck supple.     Thoracic back: Normal range of motion.     Lumbar back: Normal range of motion.       Back:     Right lower leg: No edema.     Left lower leg: No edema.     Comments: Region pain patient notes pain occurs in (worse on right side)  Lymphadenopathy:     Cervical: No cervical adenopathy.  Skin:    General: Skin is warm and dry.     Findings: No rash.  Neurological:     Mental Status: He is alert and oriented to person, place, and time. Mental status is at baseline.     Gait: Gait normal.  Psychiatric:        Mood and Affect: Mood normal.  Behavior: Behavior normal.        Thought Content: Thought content normal.     Depression Screen    07/23/2022    8:48 AM  PHQ 2/9 Scores  PHQ - 2 Score 0  PHQ- 9 Score 0   No results found for any visits on 07/23/22.  Assessment & Plan     1. Establishing care with new doctor, encounter for  2. Annual physical exam Overall unremarkable exam except as noted. Will check routine lab work listed below as well as screen for HCV.  Patient will be returning to get them drawn as he is not fasting today. - Comprehensive metabolic panel - Lipid panel - TSH Rfx on Abnormal to Free T4 - HCV Ab w Reflex to Quant PCR  3. Chronic bilateral low back pain without sciatica Pain present today.  He states it is mostly managed with home exercises, as well as nonsteroidal anti-inflammatory medications and Tylenol  prior to work shifts.  Discussed importance of avoiding overuse of anti-inflammatory medications.  Encouraged him to do his home exercises both morning and night and to not stop doing them just because his pain is improved.  Refilled his cyclobenzaprine as noted below for very infrequent need with severe stiffness. - cyclobenzaprine (FLEXERIL) 5 MG tablet; Take 1 tablet (5 mg total) by mouth 3 (three) times daily as needed for muscle spasms (Back Spasm).  Dispense: 30 tablet; Refill: 1 - Ambulatory referral to Physical Therapy  4. Mild intermittent asthma without complication Patient endorses very infrequent exacerbations of his asthma.  He states he does have an albuterol inhaler at home.  Advised him to let us know if he needs a refill.  5. HIV screening declined  6. Impacted cerumen of right ear Discussed that patient can use OTC Debrox to soften this and allow it to come out.  Patient follows up with ENT, who sometimes removes it.  Advised patient to let us (or ENT) know if it causes any decreased hearing on that side, as it will not have come out in that situation.   7. Chronic pain of both shoulders - Ambulatory referral to Physical Therapy  8. Bilateral chronic knee pain - Ambulatory referral to Physical Therapy  9. Angina pectoris Managed by cardiology   Return in about 1 year (around 07/23/2023) for CPE.     I discussed the assessment and treatment plan with the patient  The patient was provided an opportunity to ask questions and all were answered. The patient agreed with the plan and demonstrated an understanding of the instructions.   The patient was advised to call back or seek an in-person evaluation if the symptoms worsen or if the condition fails to improve as anticipated.  Total time was 60 minutes. That includes chart review before the visit, the actual patient visit, and time spent on documentation after the visit.  This does not include time spent on the physical  exam.    Sherlyn Hay, DO  [facility] [phone] (phone) [phone] (fax)  [facility]

## 2022-07-23 NOTE — Progress Notes (Signed)
   Nurse Visit   Date of Encounter: 07/23/2022 ID: Xzavian Semmel, DOB 01/31/1960, MRN [id number]  PCP:  Sherlyn Hay, DO   [facility] Providers Cardiologist:  Julien Nordmann, MD Electrophysiologist:  Lanier Prude, MD      Visit Details   VS:  BP 118/78 (BP Location: Left Arm, Patient Position: Sitting, Cuff Size: Large)   Pulse 62   Resp 16   Ht 5\' 10"  (1.778 m)   Wt 210 lb 6.4 oz (95.4 kg)   SpO2 97%   BMI 30.19 kg/m  , BMI Body mass index is 30.19 kg/m.  Wt Readings from Last 3 Encounters:  07/23/22 210 lb 6.4 oz (95.4 kg)  07/23/22 211 lb (95.7 kg)  07/09/22 210 lb 3.2 oz (95.3 kg)     Reason for visit: EKG Performed today: EKG Changes (medications, testing, etc.) : None Length of Visit: 5 minutes    Medications Adjustments/Labs and Tests Ordered: Orders Placed This Encounter  Procedures   EKG 12-Lead   No orders of the defined types were placed in this encounter.    Signed, Lenor Derrick, RN  07/23/2022 12:17 PM

## 2022-07-29 ENCOUNTER — Other Ambulatory Visit: Payer: Self-pay | Admitting: Cardiology

## 2022-07-29 ENCOUNTER — Ambulatory Visit: Payer: No Typology Code available for payment source | Admitting: Cardiology

## 2022-07-31 LAB — COMPREHENSIVE METABOLIC PANEL
ALT: 16 IU/L (ref 0–44)
AST: 17 IU/L (ref 0–40)
Albumin: 4.5 g/dL (ref 3.8–4.9)
Alkaline Phosphatase: 72 IU/L (ref 44–121)
BUN/Creatinine Ratio: 15 (ref 9–20)
BUN: 12 mg/dL (ref 6–24)
Bilirubin Total: 1.1 mg/dL (ref 0.0–1.2)
CO2: 21 mmol/L (ref 20–29)
Calcium: 9.3 mg/dL (ref 8.7–10.2)
Chloride: 101 mmol/L (ref 96–106)
Creatinine, Ser: 0.82 mg/dL (ref 0.76–1.27)
Globulin, Total: 2.7 g/dL (ref 1.5–4.5)
Glucose: 97 mg/dL (ref 70–99)
Potassium: 4.8 mmol/L (ref 3.5–5.2)
Sodium: 138 mmol/L (ref 134–144)
Total Protein: 7.2 g/dL (ref 6.0–8.5)
eGFR: 101 mL/min/{1.73_m2} (ref >59)

## 2022-07-31 LAB — LIPID PANEL
Chol/HDL Ratio: 3.7 ratio (ref 0.0–5.0)
Cholesterol, Total: 164 mg/dL (ref 100–199)
HDL: 44 mg/dL (ref >39)
LDL Chol Calc (NIH): 108 mg/dL — ABNORMAL HIGH (ref 0–99)
Triglycerides: 62 mg/dL (ref 0–149)
VLDL Cholesterol Cal: 12 mg/dL (ref 5–40)

## 2022-07-31 LAB — HCV INTERPRETATION

## 2022-07-31 LAB — TSH RFX ON ABNORMAL TO FREE T4: TSH: 2.63 u[IU]/mL (ref 0.450–4.500)

## 2022-07-31 LAB — HCV AB W REFLEX TO QUANT PCR: HCV Ab: NONREACTIVE

## 2022-08-19 NOTE — Therapy (Unsigned)
OUTPATIENT PHYSICAL THERAPY EVALUATION   Patient Name: Kyle Kim MRN: [id number] DOB:1960-10-11, 62 y.o., male Today's Date: 08/20/2022  END OF SESSION:  PT End of Session - 08/20/22 1129     Visit Number 1    Number of Visits 13    Date for PT Re-Evaluation 11/12/22    Authorization Type [employer] reporting period from 08/20/2022    Authorization Time Period VL 60 PT/OT/ST per cal yr    Authorization - Visit Number 1    Authorization - Number of Visits 60    Progress Note Due on Visit 10    PT Start Time 1125    PT Stop Time 1225    PT Time Calculation (min) 60 min    Activity Tolerance Patient tolerated treatment well    Behavior During Therapy [facility] for tasks assessed/performed             Past Medical History:  Diagnosis Date   Allergy    Arthritis    lower back   Asthma    Colon polyp    tubular adenoma   Skin cancer 04/13/2016   left neck   SVT (supraventricular tachycardia)    Wears contact lenses    Past Surgical History:  Procedure Laterality Date   CARDIAC CATHETERIZATION     COLONOSCOPY     COLONOSCOPY     x3   COLONOSCOPY WITH PROPOFOL N/A 04/16/2022   Procedure: COLONOSCOPY WITH PROPOFOL;  Surgeon: Jaynie Collins, DO;  Location: [facility] ENDOSCOPY;  Service: Gastroenterology;  Laterality: N/A;   ETHMOIDECTOMY Bilateral 11/10/2016   Procedure: ETHMOIDECTOMY;  Surgeon: Geanie Logan, MD;  Location: [facility];  Service: ENT;  Laterality: Bilateral;   FRONTAL SINUS EXPLORATION Bilateral 11/10/2016   Procedure: FRONTAL SINUS EXPLORATION;  Surgeon: Geanie Logan, MD;  Location: [facility];  Service: ENT;  Laterality: Bilateral;   IMAGE GUIDED SINUS SURGERY N/A 11/10/2016   Procedure: IMAGE GUIDED SINUS SURGERY;  Surgeon: Geanie Logan, MD;  Location: [facility];  Service: ENT;  Laterality: N/A;  GAVE DISK TO [facility] 10-23   LEFT HEART CATH AND CORONARY ANGIOGRAPHY N/A 05/30/2020   Procedure: LEFT HEART CATH AND  CORONARY ANGIOGRAPHY;  Surgeon: Antonieta Iba, MD;  Location: [facility] INVASIVE CV LAB;  Service: Cardiovascular;  Laterality: N/A;   MAXILLARY ANTROSTOMY Bilateral 11/10/2016   Procedure: MAXILLARY ANTROSTOMY WITH TISSUE REMOVAL;  Surgeon: Geanie Logan, MD;  Location: [facility];  Service: ENT;  Laterality: Bilateral;   PVC ABLATION N/A 07/22/2020   Procedure: PVC ABLATION;  Surgeon: Lanier Prude, MD;  Location: [facility] INVASIVE CV LAB;  Service: Cardiovascular;  Laterality: N/A;   SPHENOIDECTOMY Bilateral 11/10/2016   Procedure: SPHENOIDECTOMY;  Surgeon: Geanie Logan, MD;  Location: [facility];  Service: ENT;  Laterality: Bilateral;   Patient Active Problem List   Diagnosis Date Noted   Mild intermittent asthma 07/23/2022   Chronic bilateral low back pain without sciatica 07/23/2022   Ventricular tachyarrhythmia (HCC) 12/09/2020   Ventricular tachycardia (HCC) 07/22/2020   Angina pectoris (HCC) 05/30/2020   VT (ventricular tachycardia) (HCC) 05/30/2020   Near syncope 05/30/2020    PCP: Sherlyn Hay, DO  REFERRING PROVIDER: Sherlyn Hay, DO  REFERRING DIAG: chronic bilateral low back pain without sciatica, chronic pain of both shoulders, bilateral chronic knee pain  Rationale for Evaluation and Treatment: Rehabilitation  THERAPY DIAG:  Chronic pain of both knees  Other low back pain  Bilateral shoulder pain, unspecified chronicity  ONSET DATE: knee  pain and back pain in his 20s, chronic  SUBJECTIVE:                                                                                                                                                                                           SUBJECTIVE STATEMENT: Patient states he is coming to PT for pain in his knees, low back, and shoulders. He would like to focus on his knee pain first.   Patient sates he injured his left knee when playing basketball in his early 24s. He states he had an varus inury at  that time. The orthopedic surgeon said he had a medial cartilage injury and streched ACL (Pointed to the lateral knee) on the left. He decided to avoid surgery when the surgeon said his knee looked as good as he often hoped to get people after surgery. A soft cast was applied for 2 weeks and he was able to return to basketball. Every now and then it would pop when he stepped wrong but for the most part he didn't think about it until about the last 5-10 years when it bothers him some. He started trying to jog about a month ago and it was really bothering him when he jogs. He is doing a mile lap and jogging about 1/3 of that and walking 2/3 of that .Sometimes in hurts from the first steps when he goes to the jogging phase. Sometimes it calms down before he gets back to walking and sometimes it doesn't. He can jog through it, so it is not "crippling." When he twists his knee wrong it pops and hurts for a minute but then he can continue. He does not recall his knee getting so stuck the he had to use his UE to move it. He states his L knee seems to be getting better as he works back to running. He feels like his muscles are coming back and jogging is getting easier.   Patient states his right knee was kind of crushed and twisted when a toolbox fell on it (shows dynamic valgus position). He states some other people pulled the toolbox off. It fell on his leg when they were trying to pull it off a truck and it fell on his leg. This happened about 25 years ago and he states it was swollen for a few days and he doesn't recall getting care for it. It went back to feeling normal mostly. He occasionally feels something at his lateral joint line that reminds him of his other injury. His right knee is not really bothering him now except maybe going up and down stairs.  He states he is bothered when he does bodyweight squats or lunges both his knees are popping and complaining. There is a little pain with these exercises, L >  R.   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 Some days it would just lock up for 2-3 days and he would be bedridden. 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 He does not recall ever having pain going down his legs. He does not connect his knee pain to his back pain. A doctor has said he has arthritis in his lower back. His back has been "reasonable." He did PT for his back a few years back. He does a bunch of stretches and pilates-type exercises every morning mainly for his back, at least 5 days a week. Mostly he does not have a problem with his back. It is stiff in the mornings but then it loosens up with exercises. He thinks his back is maxed on the amount of improvement he can get, but he is open minded.   He has a squat rack in the basement that he doesn't use and finds himself generally being careful to not hurt himself. He states he could not sprint currently but he is hoping to get himself up to being able to run at a comfortable pace from time to time after working on jogging.   His wife has been bothering him to go out with him on some walks or some. Since he started jogging he started feeling the motivation to start exercising again and feels he is so overweight. He lost 15 lbs when he changed his diet but he plateaued since then.  3 years ago he could not do push ups because his back hurt too much. He can do push ups now and is psyched he can do that.   He came to PT because he was at a routine visit with a PCP and she asked if he was having any pain. When he mentioned his shoulders, back, and knees she suggested he come to PT.      PERTINENT HISTORY:  Patient is a 62 y.o. male who presents to outpatient physical therapy with a referral for medical diagnosis chronic bilateral low back pain without sciatica, chronic pain of both shoulders, bilateral chronic knee  pain. This patient's chief complaints consist of bilateral chronic knee pain, chronic low back pain, and bilateral shoulder pain leading to the following functional deficits: difficulty with jogging, running, squatting, stairs, lunges, exercise, working. Relevant past medical history and comorbidities include Angina pectoris (HCC); VT (ventricular tachycardia) (HCC); Near syncope; Ventricular tachycardia (HCC); Ventricular tachyarrhythmia (HCC); Mild intermittent asthma; basal cell carcinoma, and Chronic bilateral low back pain without sciatica on their problem list. He has a past medical history of Allergy, Arthritis, Asthma, Colon polyp, Skin cancer (04/13/2016), SVT (supraventricular tachycardia), and Wears contact lenses. He  has a past surgical history that includes Colonoscopy; Image guided sinus surgery (N/A, 11/10/2016); Maxillary antrostomy (Bilateral, 11/10/2016); Ethmoidectomy (Bilateral, 11/10/2016); Frontal sinus exploration (Bilateral, 11/10/2016); Sphenoidectomy (Bilateral, 11/10/2016); LEFT HEART CATH AND CORONARY ANGIOGRAPHY (N/A, 05/30/2020); PVC ABLATION (N/A, 07/22/2020); Cardiac catheterization; Colonoscopy; and Colonoscopy with propofol (N/A, 04/16/2022). Patient denies hx of stroke, seizures, diabetes, unexplained weight loss, unexplained changes in bowel or bladder problems, unexplained stumbling or dropping things, osteoporosis, and spinal surgery    PAIN:  Are you having pain? Yes  P1: L knee NPRS: Current: 0/10,  Best: 0/10, Worst: 3/10. Pain location: lateral joint line and proximal tibia near ITB attachment, possibly over patella when going up stairs.  Pain description: like a twinge, like a sharp pain but not extremely painful, like a little lightning bolt Aggravating factors: jogging (only while jogging, not after), twisting it the wrong way, maybe stairs for patellar pain.  Relieving factors: he used to land with his toes pointed inwards when playing basketball because it  did not hurt this way.    P2: L knee NPRS: Current: 0/10,  Best: 0/10, Worst: 1/10. Pain location: lateral joint line and maybe over patella Pain description: twinge Aggravating factors: maybe using stairs, lunges Relieving factors: not really, ice    FUNCTIONAL LIMITATIONS: difficulty with jogging, running, squatting, stairs, lunges, exercise, working.    LEISURE: exercising, boating, fishing  PRECAUTIONS: None   WEIGHT BEARING RESTRICTIONS: No  FALLS:  Has patient fallen in last 6 months? 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   PLOF: Independent  PATIENT GOALS: "hoping to get some extra exercises to add to his routine" "maybe I can get a little improvement" His next short term goal is to jog the whole mile loop without walking.   OBJECTIVE  DIAGNOSTIC FINDINGS:  Lumbar xray shows arthritis.   SELF- REPORTED FUNCTION FOTO score: 67/100 (knee questionnaire)  OBSERVATION/INSPECTION Posture Posture: mild forward head, rounded shoulders.  Anthropometrics Tremor: none Muscle bulk: good throughout body Edema: mildly increased volume at B lower legs.  Functional Mobility Bed mobility: supine <> sit and rolling WNL Transfers: sit <> stand WNL Gait: grossly WNL during walking with no identified deviations to be causing pain. When jogging patient demonstrates heavy heel strike with grossly symmetrical movement throughout body. Mild genuvalgus at left knee. More detailed gait analysis deferred to later date as needed.   SPINE MOTION  LUMBAR SPINE AROM *Indicates pain Flexion: fingers to floor, usual ROM  Extension: 50% Side Flexion:   R finger to patella  L finger to patella Rotation:  R WNL L WNL  PERIPHERAL JOINT MOTION (in degrees) PASSIVE RANGE OF MOTION (PROM) Comments: B hips and knees WNL including full knee flexion/extnesion bilaterally. Ankles grossly WFL  bilaterally  MUSCLE PERFORMANCE (MMT):  *Indicates pain 08/20/22 Date Date  Joint/Motion R/L R/L R/L  Shoulder     Flexion / / /  Abduction (C5) / / /  External rotation / / /  Internal rotation / / /  Extension / / /  Elbow     Flexion (C6) / / /  Extension (C7) / / /  Hand     Thumb extension (C8) / / /  Finger abduction (T1) / / /  Hip     Flexion (L1, L2) 4+/4+ / /  Extension (knee ext) 5/5 / /  Abduction 4/4+ / /  Knee     Extension (L3) 5/5 / /  Flexion (S2) 4+/4+ / /  Comments:  08/20/22: able to toe and heel walk with good clearance and no UE support.   SPECIAL TESTS:  LOWER EXTREMITY SPECIAL TESTS:  Knee sp/hip special tests: Anterior drawer: negative bilaterally (possibly slightly more laxity on L > R), Lachman Test: negative (L tested), and valgus/varus stress test at 30/0 degrees negative bilaterally, Modified Ober's: negative bilaterally  McMurray's: negative bilaterally except inconsistent popping on left with femoral IR/tibial ER, mild apprehension.  Patellafemoral grind test: positive for pain on left, negative on right.   Ober's test: positive for tension on L compared to R  LOWER LIMB NEURODYNAMIC TESTS Straight Leg Raise (Sciatic nerve)  R  = negative  L  = negative  ACCESSORY MOTION: Bilateral patellae WNL all directions.   PALPATION: TTP over left patellar tendon  FUNCTIONAL/BALANCE TESTS:  Body weight squat, top of quads near parallel with ground, slight shift to right, weight in heels, good hip hinge.   Single leg squat:  Depth to 45 degrees, bilateral unsteadiness, L > R, Left valgus, right patellar pain.   Split squat after forwards lunge: L forwards: increased left knee concordant pain during and after with good  R forwards: good form except back toe out, reports R hip flexor tension following.   Jogging: patient demonstrates heavy heel strike with grossly symmetrical movement throughout body. Mild genuvalgus at left knee. Minimal  spring. Check for over-striding next session. Reports "feeling it in left medial knee.     TODAY'S TREATMENT:  education  PATIENT EDUCATION:  Education details: Education on diagnosis, prognosis, POC, anatomy and physiology of current condition.  Reviewed cancelation/no-show policy with patient and confirmed patient has phone number for clinic; patient verbalized understanding (08/20/22). Person educated: Patient Education method: Explanation, Demonstration, Tactile cues, and Verbal cues Education comprehension: verbalized understanding, returned demonstration, and needs further education  HOME EXERCISE PROGRAM: TBA  ASSESSMENT:  CLINICAL IMPRESSION: Patient is a 62 y.o. male referred to outpatient physical therapy with a medical diagnosis of chronic bilateral low back pain without sciatica, chronic pain of both shoulders, bilateral chronic knee pain who presents with signs and symptoms consistent with chronic bilateral knee pain, L > R, chronic low back pain, and chronic bilaterally shoulder pain. Today's session focused on knee pain per patient request. 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 Patient also TTP at left patellar tendon and with positive patellar grind test on the right. He demonstrates heavy heel strike in jogging form with mild dynamic valgus on the left and likely overstride bilaterally. He has some knee discomfort bilaterally with squatting and lunging with ipsilateral anterior hip pain with lunging. He demonstrates weight shift posteriorly and to the right with bilateral squats and unsteadiness on feet and limited depth with single leg squats. Any of these objective features could contribute to the difficulty patient is having completing his desired activities without pain and limitation. Lumbar spine  screen demonstrated no evidence of nerve root or upper motor neuron involvement. Shoulder examination deferred to later date. Patient presents with significant pain, balance, motor control, edema, running form, squat form, muscle performance (strength/power/endurance), and activity tolerance impairments that are limiting ability to complete desired activities such as jogging, running, squatting, stairs, lunges, exercise, working without difficulty. Patient will benefit from skilled physical therapy intervention to address current body structure impairments and activity limitations to improve function and work towards goals set in current POC in order to return to prior level of function or maximal functional improvement.   OBJECTIVE IMPAIRMENTS: decreased activity tolerance, decreased balance, decreased coordination, decreased endurance, decreased knowledge of condition, decreased strength, increased fascial restrictions, impaired perceived functional ability, increased muscle spasms, impaired UE functional use, improper body mechanics, and pain.   ACTIVITY LIMITATIONS: squatting and stairs  PARTICIPATION LIMITATIONS: community activity, occupation, and   difficulty with jogging, running, squatting, stairs, lunges,  exercise, working  PERSONAL FACTORS: Time since onset of injury/illness/exacerbation and 3+ comorbidities:   Angina pectoris (HCC); VT (ventricular tachycardia) (HCC); Near syncope; Ventricular tachycardia (HCC); Ventricular tachyarrhythmia (HCC); Mild intermittent asthma; basal cell carcinoma, and Chronic bilateral low back pain without sciatica on their problem list. He has a past medical history of Allergy, Arthritis, Asthma, Colon polyp, Skin cancer (04/13/2016), SVT (supraventricular tachycardia), and Wears contact lenses. He  has a past surgical history that includes Colonoscopy; Image guided sinus surgery (N/A, 11/10/2016); Maxillary antrostomy (Bilateral, 11/10/2016); Ethmoidectomy  (Bilateral, 11/10/2016); Frontal sinus exploration (Bilateral, 11/10/2016); Sphenoidectomy (Bilateral, 11/10/2016); LEFT HEART CATH AND CORONARY ANGIOGRAPHY (N/A, 05/30/2020); PVC ABLATION (N/A, 07/22/2020); Cardiac catheterization; Colonoscopy; and Colonoscopy with propofol (N/A, 04/16/2022) are also affecting patient's functional outcome.   REHAB POTENTIAL: Excellent  CLINICAL DECISION MAKING: Stable/uncomplicated  EVALUATION COMPLEXITY: Low   GOALS: Goals reviewed with patient? No  SHORT TERM GOALS: Target date: 09/03/2022  Patient will be independent with initial home exercise program for self-management of symptoms. Baseline: Initial HEP to be provided at visit 2 as appropriate (08/20/22); Goal status: INITIAL   LONG TERM GOALS: Target date: 11/12/2022  Patient will be independent with a long-term home exercise program for self-management of symptoms.  Baseline: Initial HEP to be provided at visit 2 as appropriate (08/20/22); Goal status: INITIAL  2.  Patient will demonstrate improved FOTO by equal or greater than 10 points by visit #10 to demonstrate improvement in overall condition and self-reported functional ability.  Baseline: 67 (08/20/22); Goal status: INITIAL  3.  Patient will demonstrate the ability to perform single leg squat to touch buttocks on 18 inch chair with good form and no increase in pain afterwards to improve his ability to run, complete stairs, and squat for exercise.  Baseline: Depth to 45 degrees, bilateral unsteadiness, L > R, Left valgus, right patellar pain (08/20/22); Goal status: INITIAL  4.  Patient will report being able to jog during his 1 mile walk/jog without increased pain in his knees. Baseline: pain with initial steps and sometimes the entire bout of jogging (08/20/22); Goal status: INITIAL  5.  Patient will complete community, work and/or recreational activities without limitation due to current condition.  Baseline: difficulty with  jogging, running, squatting, stairs, lunges, exercise, working (08/20/22); Goal status: INITIAL   PLAN:  PT FREQUENCY: 1-2x/week  PT DURATION: 12 weeks  PLANNED INTERVENTIONS: Therapeutic exercises, Therapeutic activity, Neuromuscular re-education, Balance training, Gait training, Patient/Family education, Self Care, Joint mobilization, Stair training, Dry Needling, Electrical stimulation, Spinal mobilization, Cryotherapy, Moist heat, Manual therapy, and Re-evaluation.  PLAN FOR NEXT SESSION: further examine shoulders and back when appropriate, practice improved squat and lunge form, update HEP as appropriate, progressive LE/Core/UE/postural/functional strengthening and ROM exercises. Education.    Cira Rue, PT, DPT 08/20/2022, 5:26 PM

## 2022-08-20 ENCOUNTER — Ambulatory Visit: Payer: No Typology Code available for payment source | Attending: Family Medicine | Admitting: Physical Therapy

## 2022-08-20 ENCOUNTER — Encounter: Payer: Self-pay | Admitting: Physical Therapy

## 2022-08-20 DIAGNOSIS — M25561 Pain in right knee: Secondary | ICD-10-CM | POA: Insufficient documentation

## 2022-08-20 DIAGNOSIS — M545 Low back pain, unspecified: Secondary | ICD-10-CM | POA: Insufficient documentation

## 2022-08-20 DIAGNOSIS — G8929 Other chronic pain: Secondary | ICD-10-CM | POA: Insufficient documentation

## 2022-08-20 DIAGNOSIS — M5459 Other low back pain: Secondary | ICD-10-CM | POA: Diagnosis present

## 2022-08-20 DIAGNOSIS — M25562 Pain in left knee: Secondary | ICD-10-CM | POA: Insufficient documentation

## 2022-08-20 DIAGNOSIS — M25512 Pain in left shoulder: Secondary | ICD-10-CM | POA: Insufficient documentation

## 2022-08-20 DIAGNOSIS — M25511 Pain in right shoulder: Secondary | ICD-10-CM | POA: Diagnosis present

## 2022-08-24 NOTE — Therapy (Deleted)
OUTPATIENT PHYSICAL THERAPY TREATMENT   Patient Name: Kyle Kim MRN: [id number] DOB:[birth year], 62 y.o., male Today's Date: 08/24/2022  END OF SESSION:    Past Medical History:  Diagnosis Date   Allergy    Arthritis    lower back   Asthma    Colon polyp    tubular adenoma   Skin cancer 04/13/2016   left neck   SVT (supraventricular tachycardia)    Wears contact lenses    Past Surgical History:  Procedure Laterality Date   CARDIAC CATHETERIZATION     COLONOSCOPY     COLONOSCOPY     x3   COLONOSCOPY WITH PROPOFOL N/A 04/16/2022   Procedure: COLONOSCOPY WITH PROPOFOL;  Surgeon: Jaynie Collins, DO;  Location: [facility] ENDOSCOPY;  Service: Gastroenterology;  Laterality: N/A;   ETHMOIDECTOMY Bilateral 11/10/2016   Procedure: ETHMOIDECTOMY;  Surgeon: Geanie Logan, MD;  Location: [facility];  Service: ENT;  Laterality: Bilateral;   FRONTAL SINUS EXPLORATION Bilateral 11/10/2016   Procedure: FRONTAL SINUS EXPLORATION;  Surgeon: Geanie Logan, MD;  Location: [facility] SURGERY [facility];  Service: ENT;  Laterality: Bilateral;   IMAGE GUIDED SINUS SURGERY N/A 11/10/2016   Procedure: IMAGE GUIDED SINUS SURGERY;  Surgeon: Geanie Logan, MD;  Location: [facility] SURGERY [facility];  Service: ENT;  Laterality: N/A;  GAVE DISK TO [facility] 10-23   LEFT HEART CATH AND CORONARY ANGIOGRAPHY N/A 05/30/2020   Procedure: LEFT HEART CATH AND CORONARY ANGIOGRAPHY;  Surgeon: Antonieta Iba, MD;  Location: [facility] INVASIVE CV LAB;  Service: Cardiovascular;  Laterality: N/A;   MAXILLARY ANTROSTOMY Bilateral 11/10/2016   Procedure: MAXILLARY ANTROSTOMY WITH TISSUE REMOVAL;  Surgeon: Geanie Logan, MD;  Location: [facility];  Service: ENT;  Laterality: Bilateral;   PVC ABLATION N/A 07/22/2020   Procedure: PVC ABLATION;  Surgeon: Lanier Prude, MD;  Location: [facility] INVASIVE CV LAB;  Service: Cardiovascular;  Laterality: N/A;   SPHENOIDECTOMY Bilateral 11/10/2016   Procedure: SPHENOIDECTOMY;   Surgeon: Geanie Logan, MD;  Location: [facility] SURGERY [facility]TR;  Service: ENT;  Laterality: Bilateral;   Patient Active Problem List   Diagnosis Date Noted   Mild intermittent asthma 07/23/2022   Chronic bilateral low back pain without sciatica 07/23/2022   Ventricular tachyarrhythmia (HCC) 12/09/2020   Ventricular tachycardia (HCC) 07/22/2020   Angina pectoris (HCC) 05/30/2020   VT (ventricular tachycardia) (HCC) 05/30/2020   Near syncope 05/30/2020    PCP: Sherlyn Hay, DO  REFERRING PROVIDER: Sherlyn Hay, DO  REFERRING DIAG: chronic bilateral low back pain without sciatica, chronic pain of both shoulders, bilateral chronic knee pain  Rationale for Evaluation and Treatment: Rehabilitation  THERAPY DIAG:  No diagnosis found.  ONSET DATE: knee pain and back pain in his 57s, chronic  PERTINENT HISTORY:  Patient is a 62 y.o. male who presents to outpatient physical therapy with a referral for medical diagnosis chronic bilateral low back pain without sciatica, chronic pain of both shoulders, bilateral chronic knee pain. This patient's chief complaints consist of bilateral chronic knee pain, chronic low back pain, and bilateral shoulder pain leading to the following functional deficits: difficulty with jogging, running, squatting, stairs, lunges, exercise, working. Relevant past medical history and comorbidities include Angina pectoris (HCC); VT (ventricular tachycardia) (HCC); Near syncope; Ventricular tachycardia (HCC); Ventricular tachyarrhythmia (HCC); Mild intermittent asthma; basal cell carcinoma, and Chronic bilateral low back pain without sciatica on their problem list. He has a past medical history of Allergy, Arthritis, Asthma, Colon polyp, Skin cancer (04/13/2016), SVT (supraventricular tachycardia), and Wears contact lenses. He  has a past surgical history that includes Colonoscopy; Image guided sinus surgery (N/A, 11/10/2016); Maxillary antrostomy (Bilateral, 11/10/2016);  Ethmoidectomy (Bilateral, 11/10/2016); Frontal sinus exploration (Bilateral, 11/10/2016); Sphenoidectomy (Bilateral, 11/10/2016); LEFT HEART CATH AND CORONARY ANGIOGRAPHY (N/A, 05/30/2020); PVC ABLATION (N/A, 07/22/2020); Cardiac catheterization; Colonoscopy; and Colonoscopy with propofol (N/A, 04/16/2022). Patient denies hx of stroke, seizures, diabetes, unexplained weight loss, unexplained changes in bowel or bladder problems, unexplained stumbling or dropping things, osteoporosis, and spinal surgery  SUBJECTIVE:                                                                                                                                                                                           SUBJECTIVE STATEMENT: ***  PAIN:  NPRS: ***  PRECAUTIONS: None  PATIENT GOALS: "hoping to get some extra exercises to add to his routine" "maybe I can get a little improvement" His next short term goal is to jog the whole mile loop without walking.   OBJECTIVE  SPINE MOTION CERVICAL SPINE AROM *Indicates pain Flexion: *** Extension: *** Side Flexion:   R ***  L *** Rotation:  R *** L *** Protrusion: *** Retraction: ***  PERIPHERAL JOINT MOTION (in degrees) ACTIVE RANGE OF MOTION (AROM) ***  MUSCLE PERFORMANCE (MMT):  *Indicates pain 08/20/22 Date Date  Joint/Motion R/L R/L R/L  Shoulder     Flexion / / /  Abduction (C5) / / /  External rotation / / /  Internal rotation / / /  Extension / / /  Elbow     Flexion (C6) / / /  Extension (C7) / / /  Hand     Thumb extension (C8) / / /  Finger abduction (T1) / / /  Hip     Flexion (L1, L2) 4+/4+ / /  Extension (knee ext) 5/5 / /  Abduction 4/4+ / /  Knee     Extension (L3) 5/5 / /  Flexion (S2) 4+/4+ / /  Comments:  08/20/22: able to toe and heel walk with good clearance and no UE support.       TODAY'S TREATMENT:  education  PATIENT EDUCATION:  Education details: Education on diagnosis, prognosis, POC, anatomy and  physiology of current condition.  Reviewed cancelation/no-show policy with patient and confirmed patient has phone number for clinic; patient verbalized understanding (08/20/22). Person educated: Patient Education method: Explanation, Demonstration, Tactile cues, and Verbal cues Education comprehension: verbalized understanding, returned demonstration, and needs further education  HOME EXERCISE PROGRAM: TBA  ASSESSMENT:  CLINICAL IMPRESSION: ***  From initial PT evaluation 08/20/2022:  Patient is a 62 y.o. male referred to outpatient physical therapy with a medical diagnosis  of chronic bilateral low back pain without sciatica, chronic pain of both shoulders, bilateral chronic knee pain who presents with signs and symptoms consistent with chronic bilateral knee pain, L > R, chronic low back pain, and chronic bilaterally shoulder pain. Today's session focused on knee pain per patient request. 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 Patient also TTP at left patellar tendon and with positive patellar grind test on the right. He demonstrates heavy heel strike in jogging form with mild dynamic valgus on the left and likely overstride bilaterally. He has some knee discomfort bilaterally with squatting and lunging with ipsilateral anterior hip pain with lunging. He demonstrates weight shift posteriorly and to the right with bilateral squats and unsteadiness on feet and limited depth with single leg squats. Any of these objective features could contribute to the difficulty patient is having completing his desired activities without pain and limitation. Lumbar spine screen demonstrated no evidence of nerve root or upper motor neuron involvement. Shoulder examination deferred to later date. Patient presents with significant pain,  balance, motor control, edema, running form, squat form, muscle performance (strength/power/endurance), and activity tolerance impairments that are limiting ability to complete desired activities such as jogging, running, squatting, stairs, lunges, exercise, working without difficulty. Patient will benefit from skilled physical therapy intervention to address current body structure impairments and activity limitations to improve function and work towards goals set in current POC in order to return to prior level of function or maximal functional improvement.   OBJECTIVE IMPAIRMENTS: decreased activity tolerance, decreased balance, decreased coordination, decreased endurance, decreased knowledge of condition, decreased strength, increased fascial restrictions, impaired perceived functional ability, increased muscle spasms, impaired UE functional use, improper body mechanics, and pain.   ACTIVITY LIMITATIONS: squatting and stairs  PARTICIPATION LIMITATIONS: community activity, occupation, and   difficulty with jogging, running, squatting, stairs, lunges, exercise, working  PERSONAL FACTORS: Time since onset of injury/illness/exacerbation and 3+ comorbidities:   Angina pectoris (HCC); VT (ventricular tachycardia) (HCC); Near syncope; Ventricular tachycardia (HCC); Ventricular tachyarrhythmia (HCC); Mild intermittent asthma; basal cell carcinoma, and Chronic bilateral low back pain without sciatica on their problem list. He has a past medical history of Allergy, Arthritis, Asthma, Colon polyp, Skin cancer (04/13/2016), SVT (supraventricular tachycardia), and Wears contact lenses. He  has a past surgical history that includes Colonoscopy; Image guided sinus surgery (N/A, 11/10/2016); Maxillary antrostomy (Bilateral, 11/10/2016); Ethmoidectomy (Bilateral, 11/10/2016); Frontal sinus exploration (Bilateral, 11/10/2016); Sphenoidectomy (Bilateral, 11/10/2016); LEFT HEART CATH AND CORONARY ANGIOGRAPHY (N/A,  05/30/2020); PVC ABLATION (N/A, 07/22/2020); Cardiac catheterization; Colonoscopy; and Colonoscopy with propofol (N/A, 04/16/2022) are also affecting patient's functional outcome.   REHAB POTENTIAL: Excellent  CLINICAL DECISION MAKING: Stable/uncomplicated  EVALUATION COMPLEXITY: Low   GOALS: Goals reviewed with patient? No  SHORT TERM GOALS: Target date: 09/03/2022  Patient will be independent with initial home exercise program for self-management of symptoms. Baseline: Initial HEP to be provided at visit 2 as appropriate (08/20/22); Goal status: In-progress   LONG TERM GOALS: Target date: 11/12/2022  Patient will be independent with a long-term home exercise program for self-management of symptoms.  Baseline: Initial HEP to be provided at visit 2 as appropriate (08/20/22); Goal status: In-progress  2.  Patient will demonstrate improved FOTO by equal or greater than 10 points by visit #10 to demonstrate improvement in overall condition and self-reported functional ability.  Baseline: 67 (08/20/22); Goal status: In-progress  3.  Patient will demonstrate the ability to perform single leg squat to touch buttocks on 18 inch chair with good form and no increase in pain afterwards to improve his ability to run, complete stairs, and squat for exercise.  Baseline: Depth to 45 degrees, bilateral unsteadiness, L > R, Left valgus, right patellar pain (08/20/22); Goal status: In-progress  4.  Patient will report being able to jog during his 1 mile walk/jog without increased pain in his knees. Baseline: pain with initial steps and sometimes the entire bout of jogging (08/20/22); Goal status: In-progress  5.  Patient will complete community, work and/or recreational activities without limitation due to current condition.  Baseline: difficulty with jogging, running, squatting, stairs, lunges, exercise, working (08/20/22); Goal status: In-progress   PLAN:  PT FREQUENCY: 1-2x/week  PT  DURATION: 12 weeks  PLANNED INTERVENTIONS: Therapeutic exercises, Therapeutic activity, Neuromuscular re-education, Balance training, Gait training, Patient/Family education, Self Care, Joint mobilization, Stair training, Dry Needling, Electrical stimulation, Spinal mobilization, Cryotherapy, Moist heat, Manual therapy, and Re-evaluation.  PLAN FOR NEXT SESSION: further examine shoulders and back when appropriate, practice improved squat and lunge form, update HEP as appropriate, progressive LE/Core/UE/postural/functional strengthening and ROM exercises. Education.    Cira Rue, PT, DPT 08/24/2022, 10:37 AM

## 2022-08-25 ENCOUNTER — Ambulatory Visit: Payer: No Typology Code available for payment source | Admitting: Physical Therapy

## 2022-08-26 NOTE — Therapy (Unsigned)
OUTPATIENT PHYSICAL THERAPY TREATMENT   Patient Name: Kyle Kim MRN: [id number] DOB:July 09, 1960, 62 y.o., male Today's Date: 08/27/2022  END OF SESSION:  PT End of Session - 08/27/22 1252     Visit Number 2    Number of Visits 13    Date for PT Re-Evaluation 11/12/22    Authorization Type [employer] reporting period from 08/20/2022    Authorization Time Period VL 60 PT/OT/ST per cal yr    Authorization - Visit Number 2    Authorization - Number of Visits 60    Progress Note Due on Visit 10    PT Start Time 1120    PT Stop Time 1200    PT Time Calculation (min) 40 min    Activity Tolerance Patient tolerated treatment well    Behavior During Therapy [facility] for tasks assessed/performed              Past Medical History:  Diagnosis Date   Allergy    Arthritis    lower back   Asthma    Colon polyp    tubular adenoma   Skin cancer 04/13/2016   left neck   SVT (supraventricular tachycardia)    Wears contact lenses    Past Surgical History:  Procedure Laterality Date   CARDIAC CATHETERIZATION     COLONOSCOPY     COLONOSCOPY     x3   COLONOSCOPY WITH PROPOFOL N/A 04/16/2022   Procedure: COLONOSCOPY WITH PROPOFOL;  Surgeon: Jaynie Collins, DO;  Location: [facility] ENDOSCOPY;  Service: Gastroenterology;  Laterality: N/A;   ETHMOIDECTOMY Bilateral 11/10/2016   Procedure: ETHMOIDECTOMY;  Surgeon: Geanie Logan, MD;  Location: [facility] SURGERY CNTR;  Service: ENT;  Laterality: Bilateral;   FRONTAL SINUS EXPLORATION Bilateral 11/10/2016   Procedure: FRONTAL SINUS EXPLORATION;  Surgeon: Geanie Logan, MD;  Location: [facility]GERY [facility];  Service: ENT;  Laterality: Bilateral;   IMAGE GUIDED SINUS SURGERY N/A 11/10/2016   Procedure: IMAGE GUIDED SINUS SURGERY;  Surgeon: Geanie Logan, MD;  Location: [facility] SURGERY [facility]TR;  Service: ENT;  Laterality: N/A;  GAVE DISK TO [facility] 10-23   LEFT HEART CATH AND CORONARY ANGIOGRAPHY N/A 05/30/2020   Procedure: LEFT HEART CATH AND  CORONARY ANGIOGRAPHY;  Surgeon: Antonieta Iba, MD;  Location: [facility] INVASIVE CV LAB;  Service: Cardiovascular;  Laterality: N/A;   MAXILLARY ANTROSTOMY Bilateral 11/10/2016   Procedure: MAXILLARY ANTROSTOMY WITH TISSUE REMOVAL;  Surgeon: Geanie Logan, MD;  Location: [facility];  Service: ENT;  Laterality: Bilateral;   PVC ABLATION N/A 07/22/2020   Procedure: PVC ABLATION;  Surgeon: Lanier Prude, MD;  Location: [facility] INVASIVE CV LAB;  Service: Cardiovascular;  Laterality: N/A;   SPHENOIDECTOMY Bilateral 11/10/2016   Procedure: SPHENOIDECTOMY;  Surgeon: Geanie Logan, MD;  Location: [facility]ERY [facility];  Service: ENT;  Laterality: Bilateral;   Patient Active Problem List   Diagnosis Date Noted   Mild intermittent asthma 07/23/2022   Chronic bilateral low back pain without sciatica 07/23/2022   Ventricular tachyarrhythmia (HCC) 12/09/2020   Ventricular tachycardia (HCC) 07/22/2020   Angina pectoris (HCC) 05/30/2020   VT (ventricular tachycardia) (HCC) 05/30/2020   Near syncope 05/30/2020    PCP: Sherlyn Hay, DO  REFERRING PROVIDER: Sherlyn Hay, DO  REFERRING DIAG: chronic bilateral low back pain without sciatica, chronic pain of both shoulders, bilateral chronic knee pain  Rationale for Evaluation and Treatment: Rehabilitation  THERAPY DIAG:  Chronic pain of both knees  Other low back pain  Bilateral shoulder pain, unspecified chronicity  ONSET DATE:  knee pain and back pain in his 25s, chronic  PERTINENT HISTORY:  Patient is a 62 y.o. male who presents to outpatient physical therapy with a referral for medical diagnosis chronic bilateral low back pain without sciatica, chronic pain of both shoulders, bilateral chronic knee pain. This patient's chief complaints consist of bilateral chronic knee pain, chronic low back pain, and bilateral shoulder pain leading to the following functional deficits: difficulty with jogging, running, squatting, stairs, lunges,  exercise, working. Relevant past medical history and comorbidities include Angina pectoris (HCC); VT (ventricular tachycardia) (HCC); Near syncope; Ventricular tachycardia (HCC); Ventricular tachyarrhythmia (HCC); Mild intermittent asthma; basal cell carcinoma, and Chronic bilateral low back pain without sciatica on their problem list. He has a past medical history of Allergy, Arthritis, Asthma, Colon polyp, Skin cancer (04/13/2016), SVT (supraventricular tachycardia), and Wears contact lenses. He  has a past surgical history that includes Colonoscopy; Image guided sinus surgery (N/A, 11/10/2016); Maxillary antrostomy (Bilateral, 11/10/2016); Ethmoidectomy (Bilateral, 11/10/2016); Frontal sinus exploration (Bilateral, 11/10/2016); Sphenoidectomy (Bilateral, 11/10/2016); LEFT HEART CATH AND CORONARY ANGIOGRAPHY (N/A, 05/30/2020); PVC ABLATION (N/A, 07/22/2020); Cardiac catheterization; Colonoscopy; and Colonoscopy with propofol (N/A, 04/16/2022). Patient denies hx of stroke, seizures, diabetes, unexplained weight loss, unexplained changes in bowel or bladder problems, unexplained stumbling or dropping things, osteoporosis, and spinal surgery  SUBJECTIVE:                                                                                                                                                                                           SUBJECTIVE STATEMENT: Patient states his knees are feeling good this morning. He went for a jog/walk this morning and it felt okay. He saw his cardiologist today who is going to refer him to another guy who is going to try to "zap it." No new exercise restrictions. Patient states his knee is feeling pretty good, but he is having pain in his left hip flexor when jogging, which makes him take shorter strides.   Patient states if he does not do his exercises for his shoulders for a couple of weeks, his shoulders will start hurting. Doing his routine with his shoulders minimizes the  pain he gets when moving in various directions such as behind his back. He has a history of R shoulder injury from baseball, but he is not sure why the left one bothers him. 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 Maybe he hurt int weight lifting. He had to stop baseball due to right shoulder pain. He was throwing a ball and "tweaked it or something" so he never saw a doctor. After his 3rd year  of playing he could not throw the ball anymore due to pain so he quit baseball and never saw a doctor. He can throw overhand now and it doesn't hurt it but if he does anything remotely side arm it hurts. He has been doing his shoulder routine off and on "forever" (maybe 20 years). He developed the routine from watching others. He states his shoulders are not really bothering him much currently.   Patient states he exercises 5 days a week, one routine for "push ups day" MWFand another for "squat day" Tuesday and Saturday. Jogging Thursday and Sunday. Routine usually takes 30-35 min. He has a lot of different weights.   Leg/Squats day:  - Squat day:  - Supine heel reaches,  - Supine knees to chest, single leg then both legs, sometimes hip abduction.  - bridges, 3x10, supine marching or dead bug between sets - clamshells - sidelying hip abduction - prone on elbows pulling into distraction with mini cobra - full cobra - kneeling hip flexor stretch, with knee on table then off table.  - standing calf gastric stretch, finish with plantarflexion.  - standing heel to toe rocks, 10-12 - standing arm circles 10 each direction - hamstring stretch with foot on couch - standing quad stretch both sides - back rehab exercises: standing hip abduction AROM, single leg march, hip extension.  - standing knee up to abduction down,  - Squats: 2x15 squats (does not bother knees).  If knees are good, second set will start with lunges, do whatever he can, 2-3 or 4-6 with each leg on a good day. Subtract that  from his squats and finish the set with squats.  - Always do this with some arm exercises.   PAIN:  NPRS: 1-2/10 in left hip flexor when jogging  PRECAUTIONS: None  PATIENT GOALS: "hoping to get some extra exercises to add to his routine" "maybe I can get a little improvement" His next short term goal is to jog the whole mile loop without walking.   OBJECTIVE  TODAY'S TREATMENT:  Therapeutic exercise: to centralize symptoms and improve ROM, strength, muscular endurance, and activity tolerance required for successful completion of functional activities.  - Treadmill 2.6 mph at 0% grade with UE support. For improved lower extremity mobility, muscular endurance, and weightbearing activity tolerance; and to induce the analgesic effect of aerobic exercise, stimulate improved joint nutrition, and prepare body structures and systems for following interventions. x 6  minutes. Required assistance to set up machine.  - review of current home exercise routine related to lower extremities with patient demonstrating at least one rep of each exercise (see subjective portion for description).  - goblet squats with 10#DB held at chest, 3x10 with cuing for weight over middle of feet, education on safety of letting knees go over toes as tolerated, visual feedback with mirror and cuing to correct shift away from L LE.  - education with handouts about strength training parameters and traffic light guide to activity safe pain.  - standing hip abduction, extension, and flexion with knee flexed, using GTB loop around ankles and/or feet with UE support, 1x10 for each exercise in each direction (much more challenging than AROM but tolerated well).  - kneeling hip flexor stretch for right hip flexor with cuing on how to perform posterior pelvic tilt to bias hip flexor more effectively.  - Education on HEP  Pt required multimodal cuing for proper technique and to facilitate improved neuromuscular control, strength, range  of motion, and functional  ability resulting in improved performance and form.  PATIENT EDUCATION:  Education details: Exercise purpose/form. Self management techniques. Strength training parameters, activity safe pain, HEP updates.  Reviewed cancelation/no-show policy with patient and confirmed patient has phone number for clinic; patient verbalized understanding (08/20/22). Person educated: Patient Education method: Explanation, Demonstration, Tactile cues, and Verbal cues Education comprehension: verbalized understanding, returned demonstration, and needs further education  HOME EXERCISE PROGRAM: Updated portions of patient's previous exercise program verbally (added 10# to squats, recommended 3x10 squats, GTB for standing hip abduction, extension, and flexion).   ASSESSMENT:  CLINICAL IMPRESSION: Patient arrives reporting continued intermittent discomfort in his L > R hip flexor and knees. Today's session focused on assessing and updating patient's current exercise routine to progress towards more effective strength training for hips and knees and more effective hip flexor stretch. Patient tolerated interventions well and would benefit from reinforcement of education from today's session. Plan to progress LE strengthening and improve form for core and back exercises as tolerated. Also plan to look at jogging form and check for overstriding with attempt at improving this if present .Patient would benefit from continued management of limiting condition by skilled physical therapist to address remaining impairments and functional limitations to work towards stated goals and return to PLOF or maximal functional independence.   From initial PT evaluation 08/20/2022:  Patient is a 62 y.o. male referred to outpatient physical therapy with a medical diagnosis of chronic bilateral low back pain without sciatica, chronic pain of both shoulders, bilateral chronic knee pain who presents with signs and  symptoms consistent with chronic bilateral knee pain, L > R, chronic low back pain, and chronic bilaterally shoulder pain. Today's session focused on knee pain per patient request. 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 Patient also TTP at left patellar tendon and with positive patellar grind test on the right. He demonstrates heavy heel strike in jogging form with mild dynamic valgus on the left and likely overstride bilaterally. He has some knee discomfort bilaterally with squatting and lunging with ipsilateral anterior hip pain with lunging. He demonstrates weight shift posteriorly and to the right with bilateral squats and unsteadiness on feet and limited depth with single leg squats. Any of these objective features could contribute to the difficulty patient is having completing his desired activities without pain and limitation. Lumbar spine screen demonstrated no evidence of nerve root or upper motor neuron involvement. Shoulder examination deferred to later date. Patient presents with significant pain, balance, motor control, edema, running form, squat form, muscle performance (strength/power/endurance), and activity tolerance impairments that are limiting ability to complete desired activities such as jogging, running, squatting, stairs, lunges, exercise, working without difficulty. Patient will benefit from skilled physical therapy intervention to address current body structure impairments and activity limitations to improve function and work towards goals set in current POC in order to return to prior level of function or maximal functional improvement.   OBJECTIVE IMPAIRMENTS: decreased activity tolerance, decreased balance, decreased coordination, decreased endurance, decreased knowledge of condition, decreased strength,  increased fascial restrictions, impaired perceived functional ability, increased muscle spasms, impaired UE functional use, improper body mechanics, and pain.   ACTIVITY LIMITATIONS: squatting and stairs  PARTICIPATION LIMITATIONS: community activity, occupation, and   difficulty with jogging, running, squatting, stairs, lunges, exercise, working  PERSONAL FACTORS: Time since onset of injury/illness/exacerbation and 3+ comorbidities:  Angina pectoris (HCC); VT (ventricular tachycardia) (HCC); Near syncope; Ventricular tachycardia (HCC); Ventricular tachyarrhythmia (HCC); Mild intermittent asthma; basal cell carcinoma, and Chronic bilateral low back pain without sciatica on their problem list. He has a past medical history of Allergy, Arthritis, Asthma, Colon polyp, Skin cancer (04/13/2016), SVT (supraventricular tachycardia), and Wears contact lenses. He  has a past surgical history that includes Colonoscopy; Image guided sinus surgery (N/A, 11/10/2016); Maxillary antrostomy (Bilateral, 11/10/2016); Ethmoidectomy (Bilateral, 11/10/2016); Frontal sinus exploration (Bilateral, 11/10/2016); Sphenoidectomy (Bilateral, 11/10/2016); LEFT HEART CATH AND CORONARY ANGIOGRAPHY (N/A, 05/30/2020); PVC ABLATION (N/A, 07/22/2020); Cardiac catheterization; Colonoscopy; and Colonoscopy with propofol (N/A, 04/16/2022) are also affecting patient's functional outcome.   REHAB POTENTIAL: Excellent  CLINICAL DECISION MAKING: Stable/uncomplicated  EVALUATION COMPLEXITY: Low   GOALS: Goals reviewed with patient? No  SHORT TERM GOALS: Target date: 09/03/2022  Patient will be independent with initial home exercise program for self-management of symptoms. Baseline: Initial HEP to be provided at visit 2 as appropriate (08/20/22); Goal status: In-progress   LONG TERM GOALS: Target date: 11/12/2022  Patient will be independent with a long-term home exercise program for self-management of symptoms.  Baseline: Initial  HEP to be provided at visit 2 as appropriate (08/20/22); Goal status: In-progress  2.  Patient will demonstrate improved FOTO by equal or greater than 10 points by visit #10 to demonstrate improvement in overall condition and self-reported functional ability.  Baseline: 67 (08/20/22); Goal status: In-progress  3.  Patient will demonstrate the ability to perform single leg squat to touch buttocks on 18 inch chair with good form and no increase in pain afterwards to improve his ability to run, complete stairs, and squat for exercise.  Baseline: Depth to 45 degrees, bilateral unsteadiness, L > R, Left valgus, right patellar pain (08/20/22); Goal status: In-progress  4.  Patient will report being able to jog during his 1 mile walk/jog without increased pain in his knees. Baseline: pain with initial steps and sometimes the entire bout of jogging (08/20/22); Goal status: In-progress  5.  Patient will complete community, work and/or recreational activities without limitation due to current condition.  Baseline: difficulty with jogging, running, squatting, stairs, lunges, exercise, working (08/20/22); Goal status: In-progress   PLAN:  PT FREQUENCY: 1-2x/week  PT DURATION: 12 weeks  PLANNED INTERVENTIONS: Therapeutic exercises, Therapeutic activity, Neuromuscular re-education, Balance training, Gait training, Patient/Family education, Self Care, Joint mobilization, Stair training, Dry Needling, Electrical stimulation, Spinal mobilization, Cryotherapy, Moist heat, Manual therapy, and Re-evaluation.  PLAN FOR NEXT SESSION: further examine shoulders and back when appropriate, practice improved squat and lunge form, update HEP as appropriate, progressive LE/Core/UE/postural/functional strengthening and ROM exercises. Education.    Cira Rue, PT, DPT 08/27/2022, 2:24 PM

## 2022-08-26 NOTE — Progress Notes (Unsigned)
  Electrophysiology Office Follow up Visit Note:    Date:  08/27/2022   ID:  Kyle Kim, DOB January 24, 1960, MRN [id number]  PCP:  Sherlyn Hay, DO  [facility] Cardiologist:  Julien Nordmann, MD  [facility] Electrophysiologist:  Lanier Prude, MD    Interval History:    Kyle Kim is a 62 y.o. male who presents for a follow up visit.   Last seen 07/09/2022 for PVC/VT. At the last appointment we stopped Sotalol and started Propafenone.  He continues to have symptomatic PVCs and NSVT despite treatment with propafenone.  He does feel generally better on propafenone than he did on sotalol.  No syncope or presyncope.      Past medical, surgical, social and family history were reviewed.  ROS:   Please see the history of present illness.    All other systems reviewed and are negative.  EKGs/Labs/Other Studies Reviewed:    The following studies were reviewed today:  07/23/2022 ECG showed PR 214 and QRS duration 94ms. Previous PR was on Sotalol.   EKG Interpretation Date/Time:  Thursday August 27 2022 09:05:45 EDT Ventricular Rate:  82 PR Interval:  230 QRS Duration:  96 QT Interval:  382 QTC Calculation: 446 R Axis:   59  Text Interpretation: Sinus rhythm with 1st degree A-V block with frequent and consecutive Premature ventricular complexes Confirmed by Steffanie Dunn [phone]) on 08/27/2022 9:09:41 AM    Physical Exam:    VS:  BP 120/84 (BP Location: Left Arm, Patient Position: Sitting, Cuff Size: Large)   Pulse 82   Ht 5\' 10"  (1.778 m)   Wt 207 lb 6.4 oz (94.1 kg)   SpO2 96%   BMI 29.76 kg/m     Wt Readings from Last 3 Encounters:  08/27/22 207 lb 6.4 oz (94.1 kg)  07/23/22 210 lb 6.4 oz (95.4 kg)  07/23/22 211 lb (95.7 kg)     GEN:  Well nourished, well developed in no acute distress CARDIAC: Irregular rhythm, no murmurs, rubs, gallops RESPIRATORY:  Clear to auscultation without rales, wheezing or rhonchi       ASSESSMENT:    1. VT  (ventricular tachycardia) (HCC)   2. Encounter for long-term (current) use of high-risk medication   3. PVC's (premature ventricular contractions)    PLAN:    In order of problems listed above:   #PVC/VT Prior ablation attempt 07/22/2020.  During the procedure mapping in the left and right ventricular outflow tracts did not successfully localize a PVC origin significantly pre-QRS.  Previously on Propafenone and then Sotalol. Now changed back to Propafenone given he was having better results on this therapy. I have previously discussed sending him for another PVC ablation attempt.  I am going to reach out to Dr. Dennie Bible hranitzky at [facility] to discuss his case.  For now, continue current medications including metoprolol and propafenone.  PR 220 ms today.  He has first-degree AV delay at baseline.  Narrow QRS.  Follow-up 6 months with APP.    Signed, Steffanie Dunn, MD, [facility], [facility] 08/27/2022 9:09 AM    Electrophysiology [facility]

## 2022-08-27 ENCOUNTER — Ambulatory Visit: Payer: No Typology Code available for payment source | Attending: Cardiology | Admitting: Cardiology

## 2022-08-27 ENCOUNTER — Ambulatory Visit: Payer: No Typology Code available for payment source | Admitting: Physical Therapy

## 2022-08-27 ENCOUNTER — Encounter: Payer: Self-pay | Admitting: Cardiology

## 2022-08-27 ENCOUNTER — Encounter: Payer: Self-pay | Admitting: Physical Therapy

## 2022-08-27 VITALS — BP 120/84 | HR 82 | Ht 70.0 in | Wt 207.4 lb

## 2022-08-27 DIAGNOSIS — M5459 Other low back pain: Secondary | ICD-10-CM

## 2022-08-27 DIAGNOSIS — M25561 Pain in right knee: Secondary | ICD-10-CM | POA: Diagnosis not present

## 2022-08-27 DIAGNOSIS — I472 Ventricular tachycardia, unspecified: Secondary | ICD-10-CM | POA: Diagnosis not present

## 2022-08-27 DIAGNOSIS — Z79899 Other long term (current) drug therapy: Secondary | ICD-10-CM | POA: Diagnosis not present

## 2022-08-27 DIAGNOSIS — I493 Ventricular premature depolarization: Secondary | ICD-10-CM

## 2022-08-27 DIAGNOSIS — M25512 Pain in left shoulder: Secondary | ICD-10-CM

## 2022-08-27 DIAGNOSIS — M25511 Pain in right shoulder: Secondary | ICD-10-CM

## 2022-08-27 DIAGNOSIS — G8929 Other chronic pain: Secondary | ICD-10-CM

## 2022-08-27 NOTE — Patient Instructions (Addendum)
Medication Instructions:  Your physician recommends that you continue on your current medications as directed. Please refer to the Current Medication list given to you today.  *If you need a refill on your cardiac medications before your next appointment, please call your pharmacy*  Follow-Up: At [facility], you and your health needs are our priority.  As part of our continuing mission to provide you with exceptional heart care, we have created designated Provider Care Teams.  These Care Teams include your primary Cardiologist (physician) and Advanced Practice Providers (APPs -  Physician Assistants and Nurse Practitioners) who all work together to provide you with the care you need, when you need it.  You have been referred to Dr. Deno Lunger at [facility]

## 2022-09-01 ENCOUNTER — Encounter: Payer: No Typology Code available for payment source | Admitting: Physical Therapy

## 2022-09-03 ENCOUNTER — Ambulatory Visit: Payer: No Typology Code available for payment source | Admitting: Physical Therapy

## 2022-09-08 ENCOUNTER — Encounter: Payer: No Typology Code available for payment source | Admitting: Physical Therapy

## 2022-09-10 ENCOUNTER — Ambulatory Visit: Payer: No Typology Code available for payment source | Admitting: Physical Therapy

## 2022-09-10 ENCOUNTER — Encounter: Payer: No Typology Code available for payment source | Admitting: Physical Therapy

## 2022-09-16 NOTE — Therapy (Signed)
OUTPATIENT PHYSICAL THERAPY TREATMENT   Patient Name: Kyle Kim MRN: [id number] DOB:November 05, 1960, 62 y.o., male Today's Date: 09/17/2022  END OF SESSION:  PT End of Session - 09/17/22 1518     Visit Number 3    Number of Visits 13    Date for PT Re-Evaluation 11/12/22    Authorization Type [employer] reporting period from 08/20/2022    Authorization Time Period VL 60 PT/OT/ST per cal yr    Authorization - Number of Visits 60    Progress Note Due on Visit 10    PT Start Time 1517    PT Stop Time 1558    PT Time Calculation (min) 41 min    Activity Tolerance Patient tolerated treatment well    Behavior During Therapy [facility] for tasks assessed/performed               Past Medical History:  Diagnosis Date   Allergy    Arthritis    lower back   Asthma    Colon polyp    tubular adenoma   Skin cancer 04/13/2016   left neck   SVT (supraventricular tachycardia)    Wears contact lenses    Past Surgical History:  Procedure Laterality Date   CARDIAC CATHETERIZATION     COLONOSCOPY     COLONOSCOPY     x3   COLONOSCOPY WITH PROPOFOL N/A 04/16/2022   Procedure: COLONOSCOPY WITH PROPOFOL;  Surgeon: Jaynie Collins, DO;  Location: [facility] ENDOSCOPY;  Service: Gastroenterology;  Laterality: N/A;   ETHMOIDECTOMY Bilateral 11/10/2016   Procedure: ETHMOIDECTOMY;  Surgeon: Geanie Logan, MD;  Location: [facility] SURGERY CNTR;  Service: ENT;  Laterality: Bilateral;   FRONTAL SINUS EXPLORATION Bilateral 11/10/2016   Procedure: FRONTAL SINUS EXPLORATION;  Surgeon: Geanie Logan, MD;  Location: [facility]ERY [facility];  Service: ENT;  Laterality: Bilateral;   IMAGE GUIDED SINUS SURGERY N/A 11/10/2016   Procedure: IMAGE GUIDED SINUS SURGERY;  Surgeon: Geanie Logan, MD;  Location: [facility] SURGERY CNTR;  Service: ENT;  Laterality: N/A;  GAVE DISK TO [facility] 10-23   LEFT HEART CATH AND CORONARY ANGIOGRAPHY N/A 05/30/2020   Procedure: LEFT HEART CATH AND CORONARY ANGIOGRAPHY;  Surgeon:  Antonieta Iba, MD;  Location: [facility] INVASIVE CV LAB;  Service: Cardiovascular;  Laterality: N/A;   MAXILLARY ANTROSTOMY Bilateral 11/10/2016   Procedure: MAXILLARY ANTROSTOMY WITH TISSUE REMOVAL;  Surgeon: Geanie Logan, MD;  Location: [facility] SURGERY CN[facility];  Service: ENT;  Laterality: Bilateral;   PVC ABLATION N/A 07/22/2020   Procedure: PVC ABLATION;  Surgeon: Lanier Prude, MD;  Location: [facility] INVASIVE CV LAB;  Service: Cardiovascular;  Laterality: N/A;   SPHENOIDECTOMY Bilateral 11/10/2016   Procedure: SPHENOIDECTOMY;  Surgeon: Geanie Logan, MD;  Location: [facility] SURGERY CNTR;  Service: ENT;  Laterality: Bilateral;   Patient Active Problem List   Diagnosis Date Noted   Mild intermittent asthma 07/23/2022   Chronic bilateral low back pain without sciatica 07/23/2022   Ventricular tachyarrhythmia (HCC) 12/09/2020   Ventricular tachycardia (HCC) 07/22/2020   Angina pectoris (HCC) 05/30/2020   VT (ventricular tachycardia) (HCC) 05/30/2020   Near syncope 05/30/2020    PCP: Sherlyn Hay, DO  REFERRING PROVIDER: Sherlyn Hay, DO  REFERRING DIAG: chronic bilateral low back pain without sciatica, chronic pain of both shoulders, bilateral chronic knee pain  Rationale for Evaluation and Treatment: Rehabilitation  THERAPY DIAG:  Chronic pain of both knees  Other low back pain  Bilateral shoulder pain, unspecified chronicity  ONSET DATE: knee pain and back pain in his  20s, chronic  PERTINENT HISTORY:  Patient is a 62 y.o. male who presents to outpatient physical therapy with a referral for medical diagnosis chronic bilateral low back pain without sciatica, chronic pain of both shoulders, bilateral chronic knee pain. This patient's chief complaints consist of bilateral chronic knee pain, chronic low back pain, and bilateral shoulder pain leading to the following functional deficits: difficulty with jogging, running, squatting, stairs, lunges, exercise, working. Relevant past  medical history and comorbidities include Angina pectoris (HCC); VT (ventricular tachycardia) (HCC); Near syncope; Ventricular tachycardia (HCC); Ventricular tachyarrhythmia (HCC); Mild intermittent asthma; basal cell carcinoma, and Chronic bilateral low back pain without sciatica on their problem list. He has a past medical history of Allergy, Arthritis, Asthma, Colon polyp, Skin cancer (04/13/2016), SVT (supraventricular tachycardia), and Wears contact lenses. He  has a past surgical history that includes Colonoscopy; Image guided sinus surgery (N/A, 11/10/2016); Maxillary antrostomy (Bilateral, 11/10/2016); Ethmoidectomy (Bilateral, 11/10/2016); Frontal sinus exploration (Bilateral, 11/10/2016); Sphenoidectomy (Bilateral, 11/10/2016); LEFT HEART CATH AND CORONARY ANGIOGRAPHY (N/A, 05/30/2020); PVC ABLATION (N/A, 07/22/2020); Cardiac catheterization; Colonoscopy; and Colonoscopy with propofol (N/A, 04/16/2022). Patient denies hx of stroke, seizures, diabetes, unexplained weight loss, unexplained changes in bowel or bladder problems, unexplained stumbling or dropping things, osteoporosis, and spinal surgery  SUBJECTIVE:                                                                                                                                                                                           SUBJECTIVE STATEMENT:   Patient reports going for a jog this AM and when he would extend L hip, he would have a sharp stabbing pain.   PAIN:  NPRS: 1-2/10 in left hip flexor when jogging  PRECAUTIONS: None  PATIENT GOALS: "hoping to get some extra exercises to add to his routine" "maybe I can get a little improvement" His next short term goal is to jog the whole mile loop without walking.   OBJECTIVE  TODAY'S TREATMENT:  Therapeutic exercise: to centralize symptoms and improve ROM, strength, muscular endurance, and activity tolerance required for successful completion of functional activities.  -  Treadmill 3.0 mph at 0% grade with UE support. For improved lower extremity mobility, muscular endurance, and weightbearing activity tolerance; and to induce the analgesic effect of aerobic exercise, stimulate improved joint nutrition, and prepare body structures and systems for following interventions. x 6  minutes. Required assistance to set up machine. HR up to 108 - goblet squats with 10#KB held at chest, 3x10 with cuing for weight over middle of feet, education on safety of letting knees go over toes as tolerated, visual feedback with mirror and cuing to  correct shift away from L LE.  -TRX lunges to blue airex 2 x 10 each LE - good technique - Matrix hip abduction, extension, and flexion with knee flexed, 40# 2x10 for each exercise in each direction on each LE - kneeling hip flexor stretch with cuing on how to perform posterior pelvic tilt to bias hip flexor more effectively. 3 x 30 seconds each LE   Pt required multimodal cuing for proper technique and to facilitate improved neuromuscular control, strength, range of motion, and functional ability resulting in improved performance and form.  PATIENT EDUCATION:  Education details: Exercise purpose/form. Self management techniques. Strength training parameters, activity safe pain, HEP updates.  Reviewed cancelation/no-show policy with patient and confirmed patient has phone number for clinic; patient verbalized understanding (08/20/22). Person educated: Patient Education method: Explanation, Demonstration, Tactile cues, and Verbal cues Education comprehension: verbalized understanding, returned demonstration, and needs further education  HOME EXERCISE PROGRAM: Updated portions of patient's previous exercise program verbally (added 10# to squats, recommended 3x10 squats, GTB for standing hip abduction, extension, and flexion).   ASSESSMENT:  CLINICAL IMPRESSION:   Patient arrives to treatment session motivated to participate. Session focused  on LE strengthening and initiating lunge technique with good tolerance. Patient tolerated increase in resistance this date. Discussed continued participation with HEP. Patient would benefit from continued management of limiting condition by skilled physical therapist to address remaining impairments and functional limitations to work towards stated goals and return to PLOF or maximal functional independence.   From initial PT evaluation 08/20/2022:  Patient is a 62 y.o. male referred to outpatient physical therapy with a medical diagnosis of chronic bilateral low back pain without sciatica, chronic pain of both shoulders, bilateral chronic knee pain who presents with signs and symptoms consistent with chronic bilateral knee pain, L > R, chronic low back pain, and chronic bilaterally shoulder pain. Today's session focused on knee pain per patient request. 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 Patient also TTP at left patellar tendon and with positive patellar grind test on the right. He demonstrates heavy heel strike in jogging form with mild dynamic valgus on the left and likely overstride bilaterally. He has some knee discomfort bilaterally with squatting and lunging with ipsilateral anterior hip pain with lunging. He demonstrates weight shift posteriorly and to the right with bilateral squats and unsteadiness on feet and limited depth with single leg squats. Any of these objective features could contribute to the difficulty patient is having completing his desired activities without pain and limitation. Lumbar spine screen demonstrated no evidence of nerve root or upper motor neuron involvement. Shoulder examination deferred to later date. Patient presents with significant pain, balance, motor control, edema, running form, squat form,  muscle performance (strength/power/endurance), and activity tolerance impairments that are limiting ability to complete desired activities such as jogging, running, squatting, stairs, lunges, exercise, working without difficulty. Patient will benefit from skilled physical therapy intervention to address current body structure impairments and activity limitations to improve function and work towards goals set in current POC in order to return to prior level of function or maximal functional improvement.   OBJECTIVE IMPAIRMENTS: decreased activity tolerance, decreased balance, decreased coordination, decreased endurance, decreased knowledge of condition, decreased strength, increased fascial restrictions, impaired perceived functional ability, increased muscle spasms, impaired UE functional use, improper body mechanics, and pain.   ACTIVITY LIMITATIONS: squatting and  stairs  PARTICIPATION LIMITATIONS: community activity, occupation, and   difficulty with jogging, running, squatting, stairs, lunges, exercise, working  PERSONAL FACTORS: Time since onset of injury/illness/exacerbation and 3+ comorbidities:   Angina pectoris (HCC); VT (ventricular tachycardia) (HCC); Near syncope; Ventricular tachycardia (HCC); Ventricular tachyarrhythmia (HCC); Mild intermittent asthma; basal cell carcinoma, and Chronic bilateral low back pain without sciatica on their problem list. He has a past medical history of Allergy, Arthritis, Asthma, Colon polyp, Skin cancer (04/13/2016), SVT (supraventricular tachycardia), and Wears contact lenses. He  has a past surgical history that includes Colonoscopy; Image guided sinus surgery (N/A, 11/10/2016); Maxillary antrostomy (Bilateral, 11/10/2016); Ethmoidectomy (Bilateral, 11/10/2016); Frontal sinus exploration (Bilateral, 11/10/2016); Sphenoidectomy (Bilateral, 11/10/2016); LEFT HEART CATH AND CORONARY ANGIOGRAPHY (N/A, 05/30/2020); PVC ABLATION (N/A, 07/22/2020); Cardiac  catheterization; Colonoscopy; and Colonoscopy with propofol (N/A, 04/16/2022) are also affecting patient's functional outcome.   REHAB POTENTIAL: Excellent  CLINICAL DECISION MAKING: Stable/uncomplicated  EVALUATION COMPLEXITY: Low   GOALS: Goals reviewed with patient? No  SHORT TERM GOALS: Target date: 09/03/2022  Patient will be independent with initial home exercise program for self-management of symptoms. Baseline: Initial HEP to be provided at visit 2 as appropriate (08/20/22); Goal status: In-progress   LONG TERM GOALS: Target date: 11/12/2022  Patient will be independent with a long-term home exercise program for self-management of symptoms.  Baseline: Initial HEP to be provided at visit 2 as appropriate (08/20/22); Goal status: In-progress  2.  Patient will demonstrate improved FOTO by equal or greater than 10 points by visit #10 to demonstrate improvement in overall condition and self-reported functional ability.  Baseline: 67 (08/20/22); Goal status: In-progress  3.  Patient will demonstrate the ability to perform single leg squat to touch buttocks on 18 inch chair with good form and no increase in pain afterwards to improve his ability to run, complete stairs, and squat for exercise.  Baseline: Depth to 45 degrees, bilateral unsteadiness, L > R, Left valgus, right patellar pain (08/20/22); Goal status: In-progress  4.  Patient will report being able to jog during his 1 mile walk/jog without increased pain in his knees. Baseline: pain with initial steps and sometimes the entire bout of jogging (08/20/22); Goal status: In-progress  5.  Patient will complete community, work and/or recreational activities without limitation due to current condition.  Baseline: difficulty with jogging, running, squatting, stairs, lunges, exercise, working (08/20/22); Goal status: In-progress   PLAN:  PT FREQUENCY: 1-2x/week  PT DURATION: 12 weeks  PLANNED INTERVENTIONS: Therapeutic  exercises, Therapeutic activity, Neuromuscular re-education, Balance training, Gait training, Patient/Family education, Self Care, Joint mobilization, Stair training, Dry Needling, Electrical stimulation, Spinal mobilization, Cryotherapy, Moist heat, Manual therapy, and Re-evaluation.  PLAN FOR NEXT SESSION: further examine shoulders and back when appropriate, practice improved squat and lunge form, update HEP as appropriate, progressive LE/Core/UE/postural/functional strengthening and ROM exercises. Education.    Viviann Spare, PT, DPT 09/17/2022, 3:18 PM

## 2022-09-17 ENCOUNTER — Encounter: Payer: No Typology Code available for payment source | Admitting: Physical Therapy

## 2022-09-17 ENCOUNTER — Encounter: Payer: Self-pay | Admitting: Physical Therapy

## 2022-09-17 ENCOUNTER — Ambulatory Visit: Payer: No Typology Code available for payment source | Attending: Family Medicine

## 2022-09-17 DIAGNOSIS — M25512 Pain in left shoulder: Secondary | ICD-10-CM | POA: Diagnosis present

## 2022-09-17 DIAGNOSIS — M5459 Other low back pain: Secondary | ICD-10-CM | POA: Insufficient documentation

## 2022-09-17 DIAGNOSIS — G8929 Other chronic pain: Secondary | ICD-10-CM | POA: Diagnosis present

## 2022-09-17 DIAGNOSIS — M25561 Pain in right knee: Secondary | ICD-10-CM | POA: Insufficient documentation

## 2022-09-17 DIAGNOSIS — M25562 Pain in left knee: Secondary | ICD-10-CM | POA: Diagnosis present

## 2022-09-17 DIAGNOSIS — M25511 Pain in right shoulder: Secondary | ICD-10-CM | POA: Insufficient documentation

## 2022-09-22 ENCOUNTER — Encounter: Payer: No Typology Code available for payment source | Admitting: Physical Therapy

## 2022-09-24 ENCOUNTER — Ambulatory Visit: Payer: No Typology Code available for payment source | Admitting: Physical Therapy

## 2022-09-24 ENCOUNTER — Encounter: Payer: No Typology Code available for payment source | Admitting: Physical Therapy

## 2022-09-25 ENCOUNTER — Other Ambulatory Visit: Payer: Self-pay | Admitting: Cardiology

## 2022-09-29 ENCOUNTER — Encounter: Payer: No Typology Code available for payment source | Admitting: Physical Therapy

## 2022-10-01 ENCOUNTER — Encounter: Payer: No Typology Code available for payment source | Admitting: Physical Therapy

## 2022-10-04 IMAGING — MR MR CARD MORPHOLOGY WO/W CM
44 of 48 series · 44 of 48 positions shown · IV contrast (Contrast agent)
Comparison: none

CLINICAL DATA: Frequent PVCs, VT.  Normal cath.

EXAM:
CARDIAC MRI
TECHNIQUE: The patient was scanned on a 1.5 Tesla Siemens magnet. A dedicated
cardiac coil was used. Functional imaging was done using Fiesta
sequences. [DATE], and 4 chamber views were done to assess for RWMA's.
Modified Right rule using a short axis stack was used to
calculate an ejection fraction on a dedicated work station using
Circle software. The patient received 10 cc of Gadavist. After 10
minutes inversion recovery sequences were used to assess for
infiltration and scar tissue.
CONTRAST:  10 cc  of Gadavist

[Series 4: [id number] · axial · 8.0mm · 1.41mm/px · 1 of 18 slices shown]
[im 1/18]
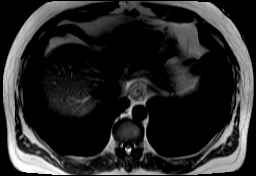

[Series 9: bSSFP · oblique · 8.0mm · 1.61mm/px · 1 of 25 slices shown (1 of 22)]
[im 1/25]
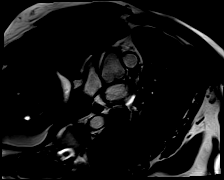

[Series 10: bSSFP · oblique · 8.0mm · 1.61mm/px · 1 of 25 slices shown (2 of 22)]
[im 1/25]
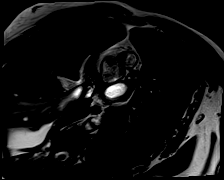

[Series 11: bSSFP · oblique · 8.0mm · 1.61mm/px · 1 of 25 slices shown (3 of 22)]
[im 1/25]
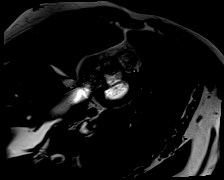

[Series 13: bSSFP · oblique · 8.0mm · 1.61mm/px · 1 of 25 slices shown (4 of 22)]
[im 1/25]
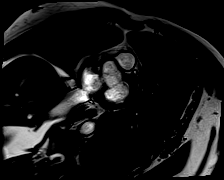

[Series 14: bSSFP · oblique · 8.0mm · 1.61mm/px · 1 of 25 slices shown (5 of 22)]
[im 1/25]
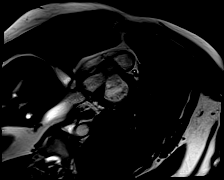

[Series 15: bSSFP · oblique · 8.0mm · 1.61mm/px · 1 of 25 slices shown (6 of 22)]
[im 1/25]
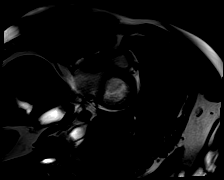

[Series 16: bSSFP · oblique · 8.0mm · 1.61mm/px · 1 of 25 slices shown (7 of 22)]
[im 1/25]
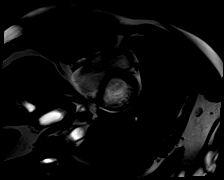

[Series 17: bSSFP · oblique · 8.0mm · 1.61mm/px · 1 of 17 slices shown (8 of 22)]
[im 1/17]
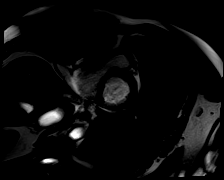

[Series 18: bSSFP · oblique · 8.0mm · 1.61mm/px · 1 of 17 slices shown (9 of 22)]
[im 1/17]
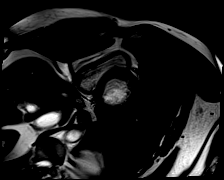

[Series 19: bSSFP · oblique · 8.0mm · 1.61mm/px · 1 of 17 slices shown (10 of 22)]
[im 1/17]
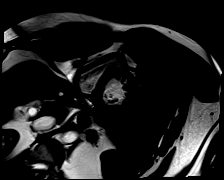

[Series 20: bSSFP · oblique · 8.0mm · 1.61mm/px · 1 of 17 slices shown (11 of 22)]
[im 1/17]
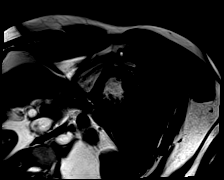

[Series 21: bSSFP · oblique · 8.0mm · 1.61mm/px · 1 of 17 slices shown (12 of 22)]
[im 1/17]
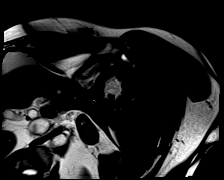

[Series 22: bSSFP · oblique · 8.0mm · 1.61mm/px · 1 of 17 slices shown (13 of 22)]
[im 1/17]
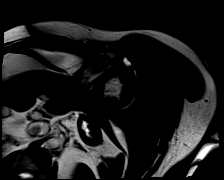

[Series 23: bSSFP · oblique · 8.0mm · 1.61mm/px · 1 of 17 slices shown (14 of 22)]
[im 1/17]
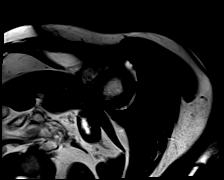

[Series 24: bSSFP · oblique · 8.0mm · 1.61mm/px · 1 of 17 slices shown (15 of 22)]
[im 1/17]
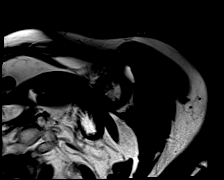

[Series 25: bSSFP · oblique · 8.0mm · 1.61mm/px · 1 of 17 slices shown (16 of 22)]
[im 1/17]
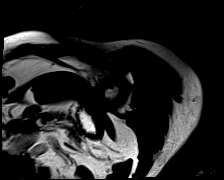

[Series 26: bSSFP · oblique · 8.0mm · 1.61mm/px · 1 of 17 slices shown (17 of 22)]
[im 1/17]
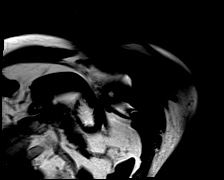

[Series 27: bSSFP · oblique · 8.0mm · 1.61mm/px · 1 of 17 slices shown (18 of 22)]
[im 1/17]
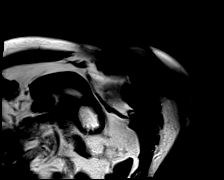

[Series 28: bSSFP · oblique · 8.0mm · 1.61mm/px · 1 of 17 slices shown (19 of 22)]
[im 1/17]
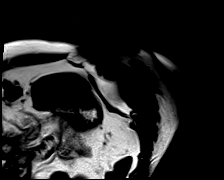

[Series 29: cine_trufi_cs_rt_short axis · oblique · 8.0mm · 1.73mm/px · 1 of 13 slices shown (1 of 18)]
[im 1/13]
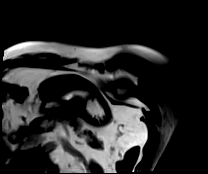

[Series 29: cine_trufi_cs_rt_short axis · oblique · 8.0mm · 1.73mm/px · 1 of 13 slices shown (2 of 18)]
[im 1/13]
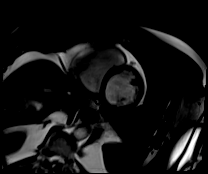

[Series 29: cine_trufi_cs_rt_short axis · oblique · 8.0mm · 1.73mm/px · 1 of 13 slices shown (3 of 18)]
[im 1/13]
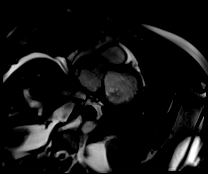

[Series 29: cine_trufi_cs_rt_short axis · oblique · 8.0mm · 1.73mm/px · 1 of 13 slices shown (4 of 18)]
[im 1/13]
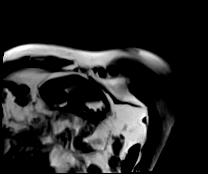

[Series 29: cine_trufi_cs_rt_short axis · oblique · 8.0mm · 1.73mm/px · 1 of 13 slices shown (5 of 18)]
[im 1/13]
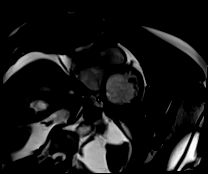

[Series 29: cine_trufi_cs_rt_short axis · oblique · 8.0mm · 1.73mm/px · 1 of 13 slices shown (6 of 18)]
[im 1/13]
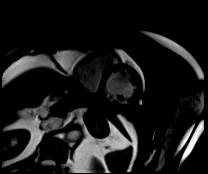

[Series 29: cine_trufi_cs_rt_short axis · oblique · 8.0mm · 1.73mm/px · 1 of 13 slices shown (7 of 18)]
[im 1/13]
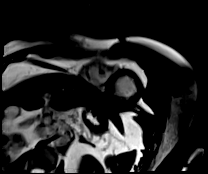

[Series 29: cine_trufi_cs_rt_short axis · oblique · 8.0mm · 1.73mm/px · 1 of 13 slices shown (8 of 18)]
[im 1/13]
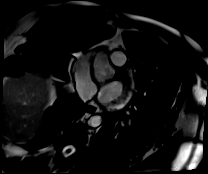

[Series 29: cine_trufi_cs_rt_short axis · oblique · 8.0mm · 1.73mm/px · 1 of 13 slices shown (9 of 18)]
[im 1/13]
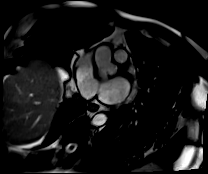

[Series 29: cine_trufi_cs_rt_short axis · oblique · 8.0mm · 1.73mm/px · 1 of 13 slices shown (10 of 18)]
[im 1/13]
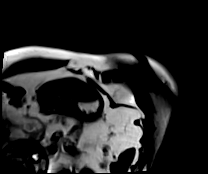

[Series 29: cine_trufi_cs_rt_short axis · oblique · 8.0mm · 1.73mm/px · 1 of 13 slices shown (11 of 18)]
[im 1/13]
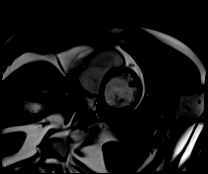

[Series 29: cine_trufi_cs_rt_short axis · oblique · 8.0mm · 1.73mm/px · 1 of 13 slices shown (12 of 18)]
[im 1/13]
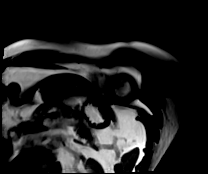

[Series 29: cine_trufi_cs_rt_short axis · oblique · 8.0mm · 1.73mm/px · 1 of 13 slices shown (13 of 18)]
[im 1/13]
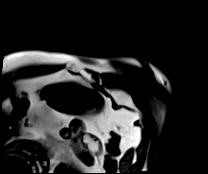

[Series 29: cine_trufi_cs_rt_short axis · oblique · 8.0mm · 1.73mm/px · 1 of 13 slices shown (14 of 18)]
[im 1/13]
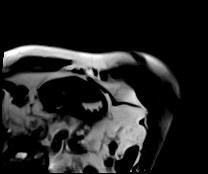

[Series 29: cine_trufi_cs_rt_short axis · oblique · 8.0mm · 1.73mm/px · 1 of 13 slices shown (15 of 18)]
[im 1/13]
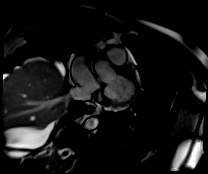

[Series 29: cine_trufi_cs_rt_short axis · oblique · 8.0mm · 1.73mm/px · 1 of 13 slices shown (16 of 18)]
[im 1/13]
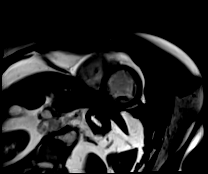

[Series 29: cine_trufi_cs_rt_short axis · oblique · 8.0mm · 1.73mm/px · 1 of 13 slices shown (17 of 18)]
[im 1/13]
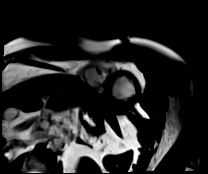

[Series 29: cine_trufi_cs_rt_short axis · oblique · 8.0mm · 1.73mm/px · 1 of 13 slices shown (18 of 18)]
[im 1/13]
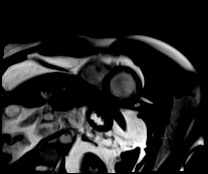

[Series 30: bSSFP · oblique · 6.0mm · 1.41mm/px · 1 of 25 slices shown (20 of 22)]
[im 1/25]
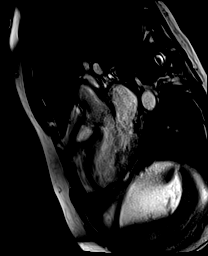

[Series 31: bSSFP · sagittal · 6.0mm · 1.41mm/px · 1 of 25 slices shown (21 of 22)]
[im 1/25]
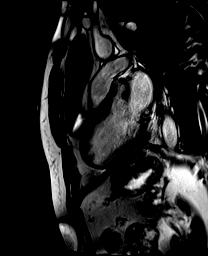

[Series 32: bSSFP · oblique · 6.0mm · 1.41mm/px · 1 of 25 slices shown (22 of 22)]
[im 1/25]
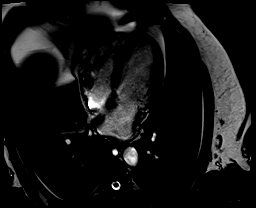

[Series 33: (id)_long_t1 · oblique · 8.0mm · 1.56mm/px · 1 of 24 slices shown]
[im 1/24]
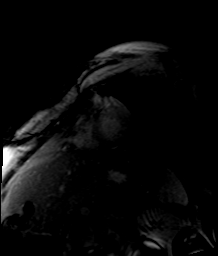

[Series 34: (id)_long_t1_moco · oblique · 8.0mm · 1.56mm/px · 1 of 24 slices shown]
[im 1/24]
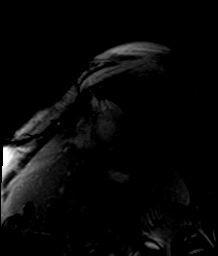

[Series 35: (id)_long_t1_moco_t1 · oblique · 8.0mm · 1.56mm/px · 1 of 6 slices shown]
[im 1/6]
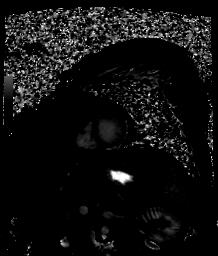

[44 of 48 positions shown; findings below may reference images not displayed]

FINDINGS: Left ventricle:

-Normal size

-Normal systolic function

-Normal ECV (26%)

-Normal T2

-No LGE

LV EF: 55% (Normal 56-78%)

Absolute volumes:

LV EDV: 117mL (Normal 77-195 mL)

LV ESV: 52mL (Normal 19-72 mL)

LV SV: 65mL (Normal 51-133 mL)

CO: 5.1L/min (Normal 2.8-8.8 L/min)

Indexed volumes:

LV EDV: 53mL/sq-m (Normal 47-92 mL/sq-m)

LV ESV: 24mL/sq-m (Normal 13-30 mL/sq-m)

LV SV: 29mL/sq-m (Normal 32-62 mL/sq-m)

CI: 2.3L/min/sq-m (Normal 1.7-4.2 L/min/sq-m)

Right ventricle: Normal size and systolic function

RV EF:  59% (Normal 47-74%)

Absolute volumes:

RV EDV: 101mL (Normal 88-227 mL)

RV ESV: 42mL (Normal 23-103 mL)

RV SV: 59mL (Normal 52-138 mL)

CO: L/min (Normal 2.8-8.8 L/min)

Indexed volumes:

RV EDV: 46mL/sq-m (Normal 55-105 mL/sq-m)

RV ESV: 19mL/sq-m (Normal 15-43 mL/sq-m)

RV SV: 27mL/sq-m (Normal 32-64 mL/sq-m)

CI: 2.1L/min/sq-m (Normal 1.7-4.2 L/min/sq-m)

Left atrium: Mild enlargement

Right atrium: Normal size

Aorta: Normal proximal ascending aorta

Pericardium: Normal
IMPRESSION: 1. Normal LV size and systolic function (EF 55%), though cardiac
motion artifact from frequent PVCs can affect quantification of
volumes

2.  Normal RV size and systolic function (EF 59%)

3.  No late gadolinium enhancement to suggest myocardial scar

4. Normal global and regional T2 values suggests no myocardial
inflammation

## 2022-10-06 ENCOUNTER — Encounter: Payer: No Typology Code available for payment source | Admitting: Physical Therapy

## 2022-10-08 ENCOUNTER — Ambulatory Visit: Payer: No Typology Code available for payment source | Admitting: Physical Therapy

## 2022-10-08 ENCOUNTER — Encounter: Payer: No Typology Code available for payment source | Admitting: Physical Therapy

## 2022-10-08 ENCOUNTER — Encounter: Payer: Self-pay | Admitting: Physical Therapy

## 2022-10-08 DIAGNOSIS — M25511 Pain in right shoulder: Secondary | ICD-10-CM

## 2022-10-08 DIAGNOSIS — M5459 Other low back pain: Secondary | ICD-10-CM

## 2022-10-08 DIAGNOSIS — G8929 Other chronic pain: Secondary | ICD-10-CM

## 2022-10-08 DIAGNOSIS — M25561 Pain in right knee: Secondary | ICD-10-CM | POA: Diagnosis not present

## 2022-10-08 NOTE — Therapy (Signed)
OUTPATIENT PHYSICAL THERAPY TREATMENT   Patient Name: Kyle Kim MRN: [id number] DOB:05/27/1960, 62 y.o., male Today's Date: 10/08/2022  END OF SESSION:  PT End of Session - 10/08/22 1003     Visit Number 4    Number of Visits 13    Date for PT Re-Evaluation 11/12/22    Authorization Type [employer] reporting period from 08/20/2022    Authorization Time Period VL 60 PT/OT/ST per cal yr    Authorization - Visit Number 4    Authorization - Number of Visits 60    Progress Note Due on Visit 10    PT Start Time [phone]    PT Stop Time 1027    PT Time Calculation (min) 40 min    Activity Tolerance Patient tolerated treatment well    Behavior During Therapy [facility] for tasks assessed/performed                Past Medical History:  Diagnosis Date   Allergy    Arthritis    lower back   Asthma    Colon polyp    tubular adenoma   Skin cancer 04/13/2016   left neck   SVT (supraventricular tachycardia)    Wears contact lenses    Past Surgical History:  Procedure Laterality Date   CARDIAC CATHETERIZATION     COLONOSCOPY     COLONOSCOPY     x3   COLONOSCOPY WITH PROPOFOL N/A 04/16/2022   Procedure: COLONOSCOPY WITH PROPOFOL;  Surgeon: Jaynie Collins, DO;  Location: [facility] ENDOSCOPY;  Service: Gastroenterology;  Laterality: N/A;   ETHMOIDECTOMY Bilateral 11/10/2016   Procedure: ETHMOIDECTOMY;  Surgeon: Geanie Logan, MD;  Location: [facility];  Service: ENT;  Laterality: Bilateral;   FRONTAL SINUS EXPLORATION Bilateral 11/10/2016   Procedure: FRONTAL SINUS EXPLORATION;  Surgeon: Geanie Logan, MD;  Location: [facility] SUR[facility];  Service: ENT;  Laterality: Bilateral;   IMAGE GUIDED SINUS SURGERY N/A 11/10/2016   Procedure: IMAGE GUIDED SINUS SURGERY;  Surgeon: Geanie Logan, MD;  Location: [facility] SURGERY CNTR;  Service: ENT;  Laterality: N/A;  GAVE DISK TO [facility] 10-23   LEFT HEART CATH AND CORONARY ANGIOGRAPHY N/A 05/30/2020   Procedure: LEFT HEART CATH AND  CORONARY ANGIOGRAPHY;  Surgeon: Antonieta Iba, MD;  Location: [facility] INVASIVE CV LAB;  Service: Cardiovascular;  Laterality: N/A;   MAXILLARY ANTROSTOMY Bilateral 11/10/2016   Procedure: MAXILLARY ANTROSTOMY WITH TISSUE REMOVAL;  Surgeon: Geanie Logan, MD;  Location: [facility] SURGERY CNTR;  Service: ENT;  Laterality: Bilateral;   PVC ABLATION N/A 07/22/2020   Procedure: PVC ABLATION;  Surgeon: Lanier Prude, MD;  Location: [facility] INVASIVE CV LAB;  Service: Cardiovascular;  Laterality: N/A;   SPHENOIDECTOMY Bilateral 11/10/2016   Procedure: SPHENOIDECTOMY;  Surgeon: Geanie Logan, MD;  Location: [facility] SURGERY CNTR;  Service: ENT;  Laterality: Bilateral;   Patient Active Problem List   Diagnosis Date Noted   Mild intermittent asthma 07/23/2022   Chronic bilateral low back pain without sciatica 07/23/2022   Ventricular tachyarrhythmia (HCC) 12/09/2020   Ventricular tachycardia (HCC) 07/22/2020   Angina pectoris (HCC) 05/30/2020   VT (ventricular tachycardia) (HCC) 05/30/2020   Near syncope 05/30/2020    PCP: Sherlyn Hay, DO  REFERRING PROVIDER: Sherlyn Hay, DO  REFERRING DIAG: chronic bilateral low back pain without sciatica, chronic pain of both shoulders, bilateral chronic knee pain  Rationale for Evaluation and Treatment: Rehabilitation  THERAPY DIAG:  Chronic pain of both knees  Other low back pain  Bilateral shoulder pain, unspecified chronicity  ONSET DATE: knee pain and back pain in his 24s, chronic  PERTINENT HISTORY:  Patient is a 62 y.o. male who presents to outpatient physical therapy with a referral for medical diagnosis chronic bilateral low back pain without sciatica, chronic pain of both shoulders, bilateral chronic knee pain. This patient's chief complaints consist of bilateral chronic knee pain, chronic low back pain, and bilateral shoulder pain leading to the following functional deficits: difficulty with jogging, running, squatting, stairs, lunges,  exercise, working. Relevant past medical history and comorbidities include Angina pectoris (HCC); VT (ventricular tachycardia) (HCC); Near syncope; Ventricular tachycardia (HCC); Ventricular tachyarrhythmia (HCC); Mild intermittent asthma; basal cell carcinoma, and Chronic bilateral low back pain without sciatica on their problem list. He has a past medical history of Allergy, Arthritis, Asthma, Colon polyp, Skin cancer (04/13/2016), SVT (supraventricular tachycardia), and Wears contact lenses. He  has a past surgical history that includes Colonoscopy; Image guided sinus surgery (N/A, 11/10/2016); Maxillary antrostomy (Bilateral, 11/10/2016); Ethmoidectomy (Bilateral, 11/10/2016); Frontal sinus exploration (Bilateral, 11/10/2016); Sphenoidectomy (Bilateral, 11/10/2016); LEFT HEART CATH AND CORONARY ANGIOGRAPHY (N/A, 05/30/2020); PVC ABLATION (N/A, 07/22/2020); Cardiac catheterization; Colonoscopy; and Colonoscopy with propofol (N/A, 04/16/2022). Patient denies hx of stroke, seizures, diabetes, unexplained weight loss, unexplained changes in bowel or bladder problems, unexplained stumbling or dropping things, osteoporosis, and spinal surgery  SUBJECTIVE:                                                                                                                                                                                           SUBJECTIVE STATEMENT:   Patient states he has had a setback because of a recurrence of an old back injury. He states he was out for 5 days, 4 of those were work days. He was in bed those 5 days with ice an taking muscle relaxers (the first 4 days he could hardly move). He saw a chiropractor twice and he thinks he "got it" last Thursday  He states his back had been "warning me for a few months" but then it just hit him on 9/9. He thought he must have twisted it in his sleep. He originally hurt his back around 30 years ago. His back spasms at the right low back and it feels like the  vertebrae are pinching the nerve and causes the muscles to bunch up. The only thing that has ever fixed it is chiropractors. He denies it ever going down his leg. He states he is feeling better than he has in 3 weeks and needs to take it easy on his back today. He states he worked out pretty hard at his last PT session (9/5) but he  does not think it was related to his back pain flair. He was sore in his back F-Sun, then did some work on Sunday and woke on Monday unable to move. He states the chiropractor never gives him exercises (several over the years). He has an ablation for his heart coming up on 11/04/2022. He stopped his exercise program when his back started hurting. He has been doing all of his back exercises today now instead of half of them daily. He weaned off of flexeril by Monday. He is still taking some ibuprofen. He just picked up again with waking/jogging on Sunday and that went okay. The left hip flexor pain seemed to have gone away. He just started back to body weight squats on Tuesday and they went okay. Started getting back to push ups an he can feel that in his back more, so he cut back to 2x10. Knees have been feeling about the same as usual.  PAIN:  NPRS: 0/10 (but states he can feel his right low back if he thinks about it).   PRECAUTIONS: None  PATIENT GOALS: "hoping to get some extra exercises to add to his routine" "maybe I can get a little improvement" His next short term goal is to jog the whole mile loop without walking.   OBJECTIVE  TODAY'S TREATMENT:  Therapeutic exercise: to centralize symptoms and improve ROM, strength, muscular endurance, and activity tolerance required for successful completion of functional activities.  - Treadmill 3.0 mph at 0% grade with UE support. For improved lower extremity mobility, muscular endurance, and weightbearing activity tolerance; and to induce the analgesic effect of aerobic exercise, stimulate improved joint nutrition, and prepare  body structures and systems for following interventions. x 10  minutes during subjective interview.  Required assistance to set up machine. -TRX lunges to blue airex 3x10 each LE - good technique - Matrix hip abduction 3x10 each side 40# (ended at rotary position 7, leg mostly extended coming across the front of the body) and flexion 2x10 each side at 40# (rotary position 7, with knee flexed). Rotary height 3, beam position 1 for all.   Pt required multimodal cuing for proper technique and to facilitate improved neuromuscular control, strength, range of motion, and functional ability resulting in improved performance and form.  PATIENT EDUCATION:  Education details: Exercise purpose/form. Self management techniques. Strength training parameters, activity safe pain, HEP updates.  Reviewed cancelation/no-show policy with patient and confirmed patient has phone number for clinic; patient verbalized understanding (08/20/22). Person educated: Patient Education method: Explanation, Demonstration, Tactile cues, and Verbal cues Education comprehension: verbalized understanding, returned demonstration, and needs further education  HOME EXERCISE PROGRAM: Updated portions of patient's previous exercise program verbally (added 10# to squats, recommended 3x10 squats, GTB for standing hip abduction, extension, and flexion).   ASSESSMENT:  CLINICAL IMPRESSION:   Patient arrives with report of flair of chronic back pain a few days after last PT session that significantly limited his activities. He is just now getting back to some of his fitness and rehab exercises. Today's session focused on hip and functional strengthening without irritating his back. He felt strong LE fatigue but no irritation of lumbar spine. Patient would benefit from continued management of limiting condition by skilled physical therapist to address remaining impairments and functional limitations to work towards stated goals and return to  PLOF or maximal functional independence.   From initial PT evaluation 08/20/2022:  Patient is a 62 y.o. male referred to outpatient physical therapy with a medical diagnosis of  chronic bilateral low back pain without sciatica, chronic pain of both shoulders, bilateral chronic knee pain who presents with signs and symptoms consistent with chronic bilateral knee pain, L > R, chronic low back pain, and chronic bilaterally shoulder pain. Today's session focused on knee pain per patient request. 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 Patient also TTP at left patellar tendon and with positive patellar grind test on the right. He demonstrates heavy heel strike in jogging form with mild dynamic valgus on the left and likely overstride bilaterally. He has some knee discomfort bilaterally with squatting and lunging with ipsilateral anterior hip pain with lunging. He demonstrates weight shift posteriorly and to the right with bilateral squats and unsteadiness on feet and limited depth with single leg squats. Any of these objective features could contribute to the difficulty patient is having completing his desired activities without pain and limitation. Lumbar spine screen demonstrated no evidence of nerve root or upper motor neuron involvement. Shoulder examination deferred to later date. Patient presents with significant pain, balance, motor control, edema, running form, squat form, muscle performance (strength/power/endurance), and activity tolerance impairments that are limiting ability to complete desired activities such as jogging, running, squatting, stairs, lunges, exercise, working without difficulty. Patient will benefit from skilled physical therapy intervention to address current body structure impairments and activity limitations to  improve function and work towards goals set in current POC in order to return to prior level of function or maximal functional improvement.   OBJECTIVE IMPAIRMENTS: decreased activity tolerance, decreased balance, decreased coordination, decreased endurance, decreased knowledge of condition, decreased strength, increased fascial restrictions, impaired perceived functional ability, increased muscle spasms, impaired UE functional use, improper body mechanics, and pain.   ACTIVITY LIMITATIONS: squatting and stairs  PARTICIPATION LIMITATIONS: community activity, occupation, and   difficulty with jogging, running, squatting, stairs, lunges, exercise, working  PERSONAL FACTORS: Time since onset of injury/illness/exacerbation and 3+ comorbidities:   Angina pectoris (HCC); VT (ventricular tachycardia) (HCC); Near syncope; Ventricular tachycardia (HCC); Ventricular tachyarrhythmia (HCC); Mild intermittent asthma; basal cell carcinoma, and Chronic bilateral low back pain without sciatica on their problem list. He has a past medical history of Allergy, Arthritis, Asthma, Colon polyp, Skin cancer (04/13/2016), SVT (supraventricular tachycardia), and Wears contact lenses. He  has a past surgical history that includes Colonoscopy; Image guided sinus surgery (N/A, 11/10/2016); Maxillary antrostomy (Bilateral, 11/10/2016); Ethmoidectomy (Bilateral, 11/10/2016); Frontal sinus exploration (Bilateral, 11/10/2016); Sphenoidectomy (Bilateral, 11/10/2016); LEFT HEART CATH AND CORONARY ANGIOGRAPHY (N/A, 05/30/2020); PVC ABLATION (N/A, 07/22/2020); Cardiac catheterization; Colonoscopy; and Colonoscopy with propofol (N/A, 04/16/2022) are also affecting patient's functional outcome.   REHAB POTENTIAL: Excellent  CLINICAL DECISION MAKING: Stable/uncomplicated  EVALUATION COMPLEXITY: Low   GOALS: Goals reviewed with patient? No  SHORT TERM GOALS: Target date: 09/03/2022  Patient will be independent with initial home exercise  program for self-management of symptoms. Baseline: Initial HEP to be provided at visit 2 as appropriate (08/20/22); Goal status: In-progress   LONG TERM GOALS: Target date: 11/12/2022  Patient will be independent with a long-term home exercise program for self-management of symptoms.  Baseline: Initial HEP to be provided at visit 2 as appropriate (08/20/22); Goal status: In-progress  2.  Patient will demonstrate improved FOTO by equal or greater than 10 points by visit #10 to demonstrate improvement in overall condition and self-reported functional ability.  Baseline: 67 (08/20/22); Goal status: In-progress  3.  Patient will demonstrate the ability to perform single leg squat to touch buttocks on 18 inch chair with good form and no increase in pain afterwards to improve his ability to run, complete stairs, and squat for exercise.  Baseline: Depth to 45 degrees, bilateral unsteadiness, L > R, Left valgus, right patellar pain (08/20/22); Goal status: In-progress  4.  Patient will report being able to jog during his 1 mile walk/jog without increased pain in his knees. Baseline: pain with initial steps and sometimes the entire bout of jogging (08/20/22); Goal status: In-progress  5.  Patient will complete community, work and/or recreational activities without limitation due to current condition.  Baseline: difficulty with jogging, running, squatting, stairs, lunges, exercise, working (08/20/22); Goal status: In-progress   PLAN:  PT FREQUENCY: 1-2x/week  PT DURATION: 12 weeks  PLANNED INTERVENTIONS: Therapeutic exercises, Therapeutic activity, Neuromuscular re-education, Balance training, Gait training, Patient/Family education, Self Care, Joint mobilization, Stair training, Dry Needling, Electrical stimulation, Spinal mobilization, Cryotherapy, Moist heat, Manual therapy, and Re-evaluation.  PLAN FOR NEXT SESSION: further examine shoulders and back when appropriate, practice improved  squat and lunge form, update HEP as appropriate, progressive LE/Core/UE/postural/functional strengthening and ROM exercises. Education.    Cira Rue, PT, DPT 10/08/2022, 10:36 AM  [facility] [address] [place], [place] [address] P: [phone] I F: [phone]

## 2022-10-13 ENCOUNTER — Encounter: Payer: No Typology Code available for payment source | Admitting: Physical Therapy

## 2022-10-15 ENCOUNTER — Encounter: Payer: Self-pay | Admitting: Physical Therapy

## 2022-10-15 ENCOUNTER — Ambulatory Visit: Payer: No Typology Code available for payment source | Attending: Family Medicine | Admitting: Physical Therapy

## 2022-10-15 DIAGNOSIS — M25512 Pain in left shoulder: Secondary | ICD-10-CM

## 2022-10-15 DIAGNOSIS — M25562 Pain in left knee: Secondary | ICD-10-CM | POA: Insufficient documentation

## 2022-10-15 DIAGNOSIS — M25511 Pain in right shoulder: Secondary | ICD-10-CM | POA: Insufficient documentation

## 2022-10-15 DIAGNOSIS — M25561 Pain in right knee: Secondary | ICD-10-CM | POA: Diagnosis present

## 2022-10-15 DIAGNOSIS — M5459 Other low back pain: Secondary | ICD-10-CM | POA: Diagnosis present

## 2022-10-15 DIAGNOSIS — G8929 Other chronic pain: Secondary | ICD-10-CM | POA: Diagnosis present

## 2022-10-15 NOTE — Therapy (Signed)
OUTPATIENT PHYSICAL THERAPY TREATMENT   Patient Name: Ether Wolters MRN: [id number] DOB:16-Jan-1960, 62 y.o., male Today's Date: 10/15/2022  END OF SESSION:  PT End of Session - 10/15/22 1255     Visit Number 5    Number of Visits 13    Date for PT Re-Evaluation 11/12/22    Authorization Type [employer] reporting period from 08/20/2022    Authorization Time Period VL 60 PT/OT/ST per cal yr    Authorization - Visit Number 5    Authorization - Number of Visits 60    Progress Note Due on Visit 10    PT Start Time 1120    PT Stop Time 1200    PT Time Calculation (min) 40 min    Activity Tolerance Patient tolerated treatment well    Behavior During Therapy [facility] for tasks assessed/performed                 Past Medical History:  Diagnosis Date   Allergy    Arthritis    lower back   Asthma    Colon polyp    tubular adenoma   Skin cancer 04/13/2016   left neck   SVT (supraventricular tachycardia) (HCC)    Wears contact lenses    Past Surgical History:  Procedure Laterality Date   CARDIAC CATHETERIZATION     COLONOSCOPY     COLONOSCOPY     x3   COLONOSCOPY WITH PROPOFOL N/A 04/16/2022   Procedure: COLONOSCOPY WITH PROPOFOL;  Surgeon: Jaynie Collins, DO;  Location: [facility] ENDOSCOPY;  Service: Gastroenterology;  Laterality: N/A;   ETHMOIDECTOMY Bilateral 11/10/2016   Procedure: ETHMOIDECTOMY;  Surgeon: Geanie Logan, MD;  Location: [facility] SURGERY [facility]TR;  Service: ENT;  Laterality: Bilateral;   FRONTAL SINUS EXPLORATION Bilateral 11/10/2016   Procedure: FRONTAL SINUS EXPLORATION;  Surgeon: Geanie Logan, MD;  Location: [facility];  Service: ENT;  Laterality: Bilateral;   IMAGE GUIDED SINUS SURGERY N/A 11/10/2016   Procedure: IMAGE GUIDED SINUS SURGERY;  Surgeon: Geanie Logan, MD;  Location: [facility] SUR[facility];  Service: ENT;  Laterality: N/A;  GAVE DISK TO [facility] 10-23   LEFT HEART CATH AND CORONARY ANGIOGRAPHY N/A 05/30/2020   Procedure: LEFT HEART  CATH AND CORONARY ANGIOGRAPHY;  Surgeon: Antonieta Iba, MD;  Location: [facility] INVASIVE CV LAB;  Service: Cardiovascular;  Laterality: N/A;   MAXILLARY ANTROSTOMY Bilateral 11/10/2016   Procedure: MAXILLARY ANTROSTOMY WITH TISSUE REMOVAL;  Surgeon: Geanie Logan, MD;  Location: [facility] SUR[facility]ERY [facility];  Service: ENT;  Laterality: Bilateral;   PVC ABLATION N/A 07/22/2020   Procedure: PVC ABLATION;  Surgeon: Lanier Prude, MD;  Location: [facility] INVASIVE CV LAB;  Service: Cardiovascular;  Laterality: N/A;   SPHENOIDECTOMY Bilateral 11/10/2016   Procedure: SPHENOIDECTOMY;  Surgeon: Geanie Logan, MD;  Location: [facility];  Service: ENT;  Laterality: Bilateral;   Patient Active Problem List   Diagnosis Date Noted   Mild intermittent asthma 07/23/2022   Chronic bilateral low back pain without sciatica 07/23/2022   Ventricular tachyarrhythmia (HCC) 12/09/2020   Ventricular tachycardia (HCC) 07/22/2020   Angina pectoris (HCC) 05/30/2020   VT (ventricular tachycardia) (HCC) 05/30/2020   Near syncope 05/30/2020    PCP: Sherlyn Hay, DO  REFERRING PROVIDER: Sherlyn Hay, DO  REFERRING DIAG: chronic bilateral low back pain without sciatica, chronic pain of both shoulders, bilateral chronic knee pain  Rationale for Evaluation and Treatment: Rehabilitation  THERAPY DIAG:  Chronic pain of both knees  Other low back pain  Bilateral shoulder pain, unspecified  chronicity  ONSET DATE: knee pain and back pain in his 65s, chronic  PERTINENT HISTORY:  Patient is a 62 y.o. male who presents to outpatient physical therapy with a referral for medical diagnosis chronic bilateral low back pain without sciatica, chronic pain of both shoulders, bilateral chronic knee pain. This patient's chief complaints consist of bilateral chronic knee pain, chronic low back pain, and bilateral shoulder pain leading to the following functional deficits: difficulty with jogging, running, squatting, stairs,  lunges, exercise, working. Relevant past medical history and comorbidities include Angina pectoris (HCC); VT (ventricular tachycardia) (HCC); Near syncope; Ventricular tachycardia (HCC); Ventricular tachyarrhythmia (HCC); Mild intermittent asthma; basal cell carcinoma, and Chronic bilateral low back pain without sciatica on their problem list. He has a past medical history of Allergy, Arthritis, Asthma, Colon polyp, Skin cancer (04/13/2016), SVT (supraventricular tachycardia), and Wears contact lenses. He  has a past surgical history that includes Colonoscopy; Image guided sinus surgery (N/A, 11/10/2016); Maxillary antrostomy (Bilateral, 11/10/2016); Ethmoidectomy (Bilateral, 11/10/2016); Frontal sinus exploration (Bilateral, 11/10/2016); Sphenoidectomy (Bilateral, 11/10/2016); LEFT HEART CATH AND CORONARY ANGIOGRAPHY (N/A, 05/30/2020); PVC ABLATION (N/A, 07/22/2020); Cardiac catheterization; Colonoscopy; and Colonoscopy with propofol (N/A, 04/16/2022). Patient denies hx of stroke, seizures, diabetes, unexplained weight loss, unexplained changes in bowel or bladder problems, unexplained stumbling or dropping things, osteoporosis, and spinal surgery  SUBJECTIVE:                                                                                                                                                                                           SUBJECTIVE STATEMENT:   Patient states he is doing okay. He states his left hip flexor pain came back about 3 days ago. It hurts worst with the first few steps and when he was trying to go for a jog this morning. During the jog he could control the pain by taking very small steps. It would hurt and feel like something was jabbing into the front of his hip if the steps were too long. He states he feels his back is about healed. He states he was a little sore in his thighs and for several days after last PT session. He did squats on Saturday and Tuesday but has not tried  squats. He does not recall anything that started his hip pain this time.   PAIN:  NPRS: 0/10 (but states he can feel his right low back if he thinks about it).   PRECAUTIONS: None  PATIENT GOALS: "hoping to get some extra exercises to add to his routine" "maybe I can get a little improvement" His next short term goal is to jog the whole mile loop without  walking.   OBJECTIVE  FUNCTIONAL TESTING Concordant anterior L hip pain with terminal stance to toe off during jogging.   HIP SPECIAL TESTS:  FABER R = negative for concordant pain (tight, painful posterior hip).  FADDIR: R = positive! For concordant pain, L = positive for similar but less sharp pain.  Thomas test: R and L = lacks knee flexion passively (but no rise in knee with manual flexion of knee).  Resisted thomas test = R painful with ER but not neutral or IR, L = mild discomfort in ER but not in neutral or IR.   PROM L hip full  ACCESSORY MOTION No change during LAD through L hip, but feels better jogging after.   PALPATION Mild TTP over L TFL - difficulty localizing pain.     TODAY'S TREATMENT:  Therapeutic exercise: to centralize symptoms and improve ROM, strength, muscular endurance, and activity tolerance required for successful completion of functional activities.  - Treadmill 3.2 mph at 0% grade with UE support. For improved lower extremity mobility, muscular endurance, and weightbearing activity tolerance; and to induce the analgesic effect of aerobic exercise, stimulate improved joint nutrition, and prepare body structures and systems for following interventions. x 5  minutes during subjective interview.  Required assistance to set up machine. - jogging back and forth in clinic several times throughout the session to assess effects of interventions.  - testing to elucidate hip pain (see above).  - standing left hip flexion with knee extended, AROM, 1x10, U UE support - standing left hip flexion with knee flexed  with TB loop around feet, 1x10 with RTB, 1x10 with BlueTB - standing running man, L SLS, 1x10 - education on how to hang left LE off edge of step with weight attached for gentle traction to left hip.   Manual therapy: to reduce pain and tissue tension, improve range of motion, neuromodulation, in order to promote improved ability to complete functional activities. SUPINE - LAD through left hip, 3x45 seconds with belt - STM to soft tissue at left lateral and anterior hip SIDELYING - STM to soft tissue at left lateral and anterior hip  Pt required multimodal cuing for proper technique and to facilitate improved neuromuscular control, strength, range of motion, and functional ability resulting in improved performance and form.  PATIENT EDUCATION:  Education details: Exercise purpose/form. Self management techniques.  Reviewed cancelation/no-show policy with patient and confirmed patient has phone number for clinic; patient verbalized understanding (08/20/22). Person educated: Patient Education method: Explanation, Demonstration, Tactile cues, and Verbal cues Education comprehension: verbalized understanding, returned demonstration, and needs further education  HOME EXERCISE PROGRAM: Updated portions of patient's previous exercise program verbally (added 10# to squats, recommended 3x10 squats, GTB for standing hip abduction, extension, and flexion).   Access Code: [email or web address] URL: [email or web address] Date: 10/15/2022 Prepared by: Norton Blizzard  Exercises - Standing Hip Flexion with Counter Support  - 3-4 x weekly - 3 sets - 10 reps - 1 second hold - Supine Hip Flexion with Resistance Loop  - 3-4 x weekly - 3 sets - 10 reps - 1 second hold - Single Leg Running Balance  - 3-7 x weekly - 3 sets - 10 reps  ASSESSMENT:  CLINICAL IMPRESSION:   Patient arrives with improved low back pain and exercise participation but reporting return of left anterior hip pain that worsens with  initial standing/walking and prevents him from jogging without severely restricting step length. He reports pain with toe off phase of gait.  He had difficulty  localizing with palpation and appeared to have improvement in symptoms following LAD to the left hip. He had strong concordant pain with FADIR test. He had good L hip PROM with no catching. 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 HEP updated with hip flexor strengthening exercise. Plan to re-assess for tolerance and readiness for progressions next session. Patient would benefit from continued management of limiting condition by skilled physical therapist to address remaining impairments and functional limitations to work towards stated goals and return to PLOF or maximal functional independence.   From initial PT evaluation 08/20/2022:  Patient is a 62 y.o. male referred to outpatient physical therapy with a medical diagnosis of chronic bilateral low back pain without sciatica, chronic pain of both shoulders, bilateral chronic knee pain who presents with signs and symptoms consistent with chronic bilateral knee pain, L > R, chronic low back pain, and chronic bilaterally shoulder pain. Today's session focused on knee pain per patient request. 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 Patient also TTP at left patellar tendon and with positive patellar grind test on the right. He demonstrates heavy heel strike in jogging form with mild dynamic valgus on the left and likely overstride bilaterally. He has some knee discomfort bilaterally with squatting and lunging with ipsilateral anterior hip pain with lunging. He  demonstrates weight shift posteriorly and to the right with bilateral squats and unsteadiness on feet and limited depth with single leg squats. Any of these objective features could contribute to the difficulty patient is having completing his desired activities without pain and limitation. Lumbar spine screen demonstrated no evidence of nerve root or upper motor neuron involvement. Shoulder examination deferred to later date. Patient presents with significant pain, balance, motor control, edema, running form, squat form, muscle performance (strength/power/endurance), and activity tolerance impairments that are limiting ability to complete desired activities such as jogging, running, squatting, stairs, lunges, exercise, working without difficulty. Patient will benefit from skilled physical therapy intervention to address current body structure impairments and activity limitations to improve function and work towards goals set in current POC in order to return to prior level of function or maximal functional improvement.   OBJECTIVE IMPAIRMENTS: decreased activity tolerance, decreased balance, decreased coordination, decreased endurance, decreased knowledge of condition, decreased strength, increased fascial restrictions, impaired perceived functional ability, increased muscle spasms, impaired UE functional use, improper body mechanics, and pain.   ACTIVITY LIMITATIONS: squatting and stairs  PARTICIPATION LIMITATIONS: community activity, occupation, and   difficulty with jogging, running, squatting, stairs, lunges, exercise, working  PERSONAL FACTORS: Time since onset of injury/illness/exacerbation and 3+ comorbidities:   Angina pectoris (HCC); VT (ventricular tachycardia) (HCC); Near syncope; Ventricular tachycardia (HCC); Ventricular tachyarrhythmia (HCC); Mild intermittent asthma; basal cell carcinoma, and Chronic bilateral low back pain without sciatica on their problem list. He has a past medical history  of Allergy, Arthritis, Asthma, Colon polyp, Skin cancer (04/13/2016), SVT (supraventricular tachycardia), and Wears contact lenses. He  has a past surgical history that includes Colonoscopy; Image guided sinus surgery (N/A, 11/10/2016); Maxillary antrostomy (Bilateral, 11/10/2016); Ethmoidectomy (Bilateral, 11/10/2016); Frontal sinus exploration (Bilateral, 11/10/2016); Sphenoidectomy (Bilateral, 11/10/2016); LEFT HEART CATH AND CORONARY ANGIOGRAPHY (N/A, 05/30/2020); PVC ABLATION (N/A, 07/22/2020); Cardiac catheterization; Colonoscopy; and Colonoscopy with propofol (N/A,  04/16/2022) are also affecting patient's functional outcome.   REHAB POTENTIAL: Excellent  CLINICAL DECISION MAKING: Stable/uncomplicated  EVALUATION COMPLEXITY: Low   GOALS: Goals reviewed with patient? No  SHORT TERM GOALS: Target date: 09/03/2022  Patient will be independent with initial home exercise program for self-management of symptoms. Baseline: Initial HEP to be provided at visit 2 as appropriate (08/20/22); Goal status: In-progress   LONG TERM GOALS: Target date: 11/12/2022  Patient will be independent with a long-term home exercise program for self-management of symptoms.  Baseline: Initial HEP to be provided at visit 2 as appropriate (08/20/22); Goal status: In-progress  2.  Patient will demonstrate improved FOTO by equal or greater than 10 points by visit #10 to demonstrate improvement in overall condition and self-reported functional ability.  Baseline: 67 (08/20/22); Goal status: In-progress  3.  Patient will demonstrate the ability to perform single leg squat to touch buttocks on 18 inch chair with good form and no increase in pain afterwards to improve his ability to run, complete stairs, and squat for exercise.  Baseline: Depth to 45 degrees, bilateral unsteadiness, L > R, Left valgus, right patellar pain (08/20/22); Goal status: In-progress  4.  Patient will report being able to jog during his 1  mile walk/jog without increased pain in his knees. Baseline: pain with initial steps and sometimes the entire bout of jogging (08/20/22); Goal status: In-progress  5.  Patient will complete community, work and/or recreational activities without limitation due to current condition.  Baseline: difficulty with jogging, running, squatting, stairs, lunges, exercise, working (08/20/22); Goal status: In-progress   PLAN:  PT FREQUENCY: 1-2x/week  PT DURATION: 12 weeks  PLANNED INTERVENTIONS: Therapeutic exercises, Therapeutic activity, Neuromuscular re-education, Balance training, Gait training, Patient/Family education, Self Care, Joint mobilization, Stair training, Dry Needling, Electrical stimulation, Spinal mobilization, Cryotherapy, Moist heat, Manual therapy, and Re-evaluation.  PLAN FOR NEXT SESSION: further examine shoulders and back when appropriate, practice improved squat and lunge form, update HEP as appropriate, progressive LE/Core/UE/postural/functional strengthening and ROM exercises. Education.    Cira Rue, PT, DPT 10/15/2022, 4:39 PM  [facility] [address] [place], [place] [address] P: [phone] I F: [phone]

## 2022-10-20 ENCOUNTER — Encounter: Payer: No Typology Code available for payment source | Admitting: Physical Therapy

## 2022-10-22 ENCOUNTER — Encounter: Payer: Self-pay | Admitting: Physical Therapy

## 2022-10-22 ENCOUNTER — Ambulatory Visit: Payer: No Typology Code available for payment source | Admitting: Physical Therapy

## 2022-10-22 DIAGNOSIS — M25561 Pain in right knee: Secondary | ICD-10-CM | POA: Diagnosis not present

## 2022-10-22 DIAGNOSIS — M5459 Other low back pain: Secondary | ICD-10-CM

## 2022-10-22 DIAGNOSIS — G8929 Other chronic pain: Secondary | ICD-10-CM

## 2022-10-22 DIAGNOSIS — M25511 Pain in right shoulder: Secondary | ICD-10-CM

## 2022-10-22 NOTE — Therapy (Signed)
OUTPATIENT PHYSICAL THERAPY TREATMENT   Patient Name: Kyle Kim MRN: [id number] DOB:1960-07-17, 62 y.o., male Today's Date: 10/22/2022  END OF SESSION:  PT End of Session - 10/22/22 1203     Visit Number 6    Number of Visits 13    Date for PT Re-Evaluation 11/12/22    Authorization Type [employer] reporting period from 08/20/2022    Authorization Time Period VL 60 PT/OT/ST per cal yr    Authorization - Visit Number 6    Authorization - Number of Visits 60    Progress Note Due on Visit 10    PT Start Time 1117    PT Stop Time 1200    PT Time Calculation (min) 43 min    Activity Tolerance Patient tolerated treatment well    Behavior During Therapy [facility] for tasks assessed/performed                  Past Medical History:  Diagnosis Date   Allergy    Arthritis    lower back   Asthma    Colon polyp    tubular adenoma   Skin cancer 04/13/2016   left neck   SVT (supraventricular tachycardia) (HCC)    Wears contact lenses    Past Surgical History:  Procedure Laterality Date   CARDIAC CATHETERIZATION     COLONOSCOPY     COLONOSCOPY     x3   COLONOSCOPY WITH PROPOFOL N/A 04/16/2022   Procedure: COLONOSCOPY WITH PROPOFOL;  Surgeon: Jaynie Collins, DO;  Location: [facility] ENDOSCOPY;  Service: Gastroenterology;  Laterality: N/A;   ETHMOIDECTOMY Bilateral 11/10/2016   Procedure: ETHMOIDECTOMY;  Surgeon: Geanie Logan, MD;  Location: [facility] SUR[facility]ERY [facility];  Service: ENT;  Laterality: Bilateral;   FRONTAL SINUS EXPLORATION Bilateral 11/10/2016   Procedure: FRONTAL SINUS EXPLORATION;  Surgeon: Geanie Logan, MD;  Location: [facility];  Service: ENT;  Laterality: Bilateral;   IMAGE GUIDED SINUS SURGERY N/A 11/10/2016   Procedure: IMAGE GUIDED SINUS SURGERY;  Surgeon: Geanie Logan, MD;  Location: [facility];  Service: ENT;  Laterality: N/A;  GAVE DISK TO [facility] 10-23   LEFT HEART CATH AND CORONARY ANGIOGRAPHY N/A 05/30/2020   Procedure: LEFT  HEART CATH AND CORONARY ANGIOGRAPHY;  Surgeon: Antonieta Iba, MD;  Location: [facility] INVASIVE CV LAB;  Service: Cardiovascular;  Laterality: N/A;   MAXILLARY ANTROSTOMY Bilateral 11/10/2016   Procedure: MAXILLARY ANTROSTOMY WITH TISSUE REMOVAL;  Surgeon: Geanie Logan, MD;  Location: [facility];  Service: ENT;  Laterality: Bilateral;   PVC ABLATION N/A 07/22/2020   Procedure: PVC ABLATION;  Surgeon: Lanier Prude, MD;  Location: [facility] INVASIVE CV LAB;  Service: Cardiovascular;  Laterality: N/A;   SPHENOIDECTOMY Bilateral 11/10/2016   Procedure: SPHENOIDECTOMY;  Surgeon: Geanie Logan, MD;  Location: [facility];  Service: ENT;  Laterality: Bilateral;   Patient Active Problem List   Diagnosis Date Noted   Mild intermittent asthma 07/23/2022   Chronic bilateral low back pain without sciatica 07/23/2022   Ventricular tachyarrhythmia (HCC) 12/09/2020   Ventricular tachycardia (HCC) 07/22/2020   Angina pectoris (HCC) 05/30/2020   VT (ventricular tachycardia) (HCC) 05/30/2020   Near syncope 05/30/2020    PCP: Sherlyn Hay, DO  REFERRING PROVIDER: Sherlyn Hay, DO  REFERRING DIAG: chronic bilateral low back pain without sciatica, chronic pain of both shoulders, bilateral chronic knee pain  Rationale for Evaluation and Treatment: Rehabilitation  THERAPY DIAG:  Chronic pain of both knees  Other low back pain  Bilateral shoulder pain,  unspecified chronicity  ONSET DATE: knee pain and back pain in his 33s, chronic  PERTINENT HISTORY:  Patient is a 62 y.o. male who presents to outpatient physical therapy with a referral for medical diagnosis chronic bilateral low back pain without sciatica, chronic pain of both shoulders, bilateral chronic knee pain. This patient's chief complaints consist of bilateral chronic knee pain, chronic low back pain, and bilateral shoulder pain leading to the following functional deficits: difficulty with jogging, running, squatting,  stairs, lunges, exercise, working. Relevant past medical history and comorbidities include Angina pectoris (HCC); VT (ventricular tachycardia) (HCC); Near syncope; Ventricular tachycardia (HCC); Ventricular tachyarrhythmia (HCC); Mild intermittent asthma; basal cell carcinoma, and Chronic bilateral low back pain without sciatica on their problem list. He has a past medical history of Allergy, Arthritis, Asthma, Colon polyp, Skin cancer (04/13/2016), SVT (supraventricular tachycardia), and Wears contact lenses. He  has a past surgical history that includes Colonoscopy; Image guided sinus surgery (N/A, 11/10/2016); Maxillary antrostomy (Bilateral, 11/10/2016); Ethmoidectomy (Bilateral, 11/10/2016); Frontal sinus exploration (Bilateral, 11/10/2016); Sphenoidectomy (Bilateral, 11/10/2016); LEFT HEART CATH AND CORONARY ANGIOGRAPHY (N/A, 05/30/2020); PVC ABLATION (N/A, 07/22/2020); Cardiac catheterization; Colonoscopy; and Colonoscopy with propofol (N/A, 04/16/2022). Patient denies hx of stroke, seizures, diabetes, unexplained weight loss, unexplained changes in bowel or bladder problems, unexplained stumbling or dropping things, osteoporosis, and spinal surgery  SUBJECTIVE:                                                                                                                                                                                           SUBJECTIVE STATEMENT:   Patient states he is doing well. He states the exercise for his hip pain has helped a lot. He states he went for a jog/walk this morning. Tuesday he did 2x10 lunges and 1x10 body weight squats and that went alright.   PAIN:  NPRS: 0/10 "nothing worth mentioning"  PRECAUTIONS: None  PATIENT GOALS: "hoping to get some extra exercises to add to his routine" "maybe I can get a little improvement" His next short term goal is to jog the whole mile loop without walking.   OBJECTIVE  TODAY'S TREATMENT:  Therapeutic exercise: to centralize  symptoms and improve ROM, strength, muscular endurance, and activity tolerance required for successful completion of functional activities.  - Treadmill 3.3 mph at 0% grade with UE support. For improved lower extremity mobility, muscular endurance, and weightbearing activity tolerance; and to induce the analgesic effect of aerobic exercise, stimulate improved joint nutrition, and prepare body structures and systems for following interventions. x 5  minutes during subjective interview.  Required assistance to set up machine.  Matrix hip superset: - standing abduction  3x10 each side 40#  rotary position 7, leg mostly extended coming across the front of the body) Rotary height 3, beam position 1.  - standing flexion 3x10 each side at 40# (rotary position 7, with knee flexed). Rotary height 3, beam position 1.  - runner's step up, 3x10 each side, UE support available but not used.  - standing running man, 3x10 each side  Pt required multimodal cuing for proper technique and to facilitate improved neuromuscular control, strength, range of motion, and functional ability resulting in improved performance and form.  PATIENT EDUCATION:  Education details: Exercise purpose/form. Self management techniques.  Reviewed cancelation/no-show policy with patient and confirmed patient has phone number for clinic; patient verbalized understanding (08/20/22). Person educated: Patient Education method: Explanation, Demonstration, Tactile cues, and Verbal cues Education comprehension: verbalized understanding, returned demonstration, and needs further education  HOME EXERCISE PROGRAM: Updated portions of patient's previous exercise program verbally (added 10# to squats, recommended 3x10 squats, GTB for standing hip abduction, extension, and flexion).   Access Code: [email or web address] URL: [email or web address] Date: 10/15/2022 Prepared by: Norton Blizzard  Exercises - Standing Hip Flexion with Counter Support   - 3-4 x weekly - 3 sets - 10 reps - 1 second hold - Supine Hip Flexion with Resistance Loop  - 3-4 x weekly - 3 sets - 10 reps - 1 second hold - Single Leg Running Balance  - 3-7 x weekly - 3 sets - 10 reps  ASSESSMENT:  CLINICAL IMPRESSION:   Patient arrives reporting significant improvement in pain. Today's session returned to hip and LE functional and isolated strengthening to improve his activity tolerance for work and running goals. He reported mild discomfort in his left knee with step up that was resolving by end of session. Patient educated on acceptable and movement safe pain while working to improve activity tolerance. Plan to turn more attention to knees next session as tolerated and appropriate. Patient would benefit from continued management of limiting condition by skilled physical therapist to address remaining impairments and functional limitations to work towards stated goals and return to PLOF or maximal functional independence.   From initial PT evaluation 08/20/2022:  Patient is a 62 y.o. male referred to outpatient physical therapy with a medical diagnosis of chronic bilateral low back pain without sciatica, chronic pain of both shoulders, bilateral chronic knee pain who presents with signs and symptoms consistent with chronic bilateral knee pain, L > R, chronic low back pain, and chronic bilaterally shoulder pain. Today's session focused on knee pain per patient request. 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 Patient also TTP at left patellar tendon and with positive patellar grind test on the right. He demonstrates heavy heel strike in jogging form with mild dynamic valgus on the left and likely overstride bilaterally. He has some knee discomfort bilaterally with squatting and lunging with ipsilateral  anterior hip pain with lunging. He demonstrates weight shift posteriorly and to the right with bilateral squats and unsteadiness on feet and limited depth with single leg squats. Any of these objective features could contribute to the difficulty patient is having completing his desired activities without pain and limitation. Lumbar spine screen demonstrated no evidence of nerve root or upper motor neuron involvement. Shoulder examination deferred to later date. Patient presents with significant pain, balance, motor control, edema, running form, squat form, muscle performance (strength/power/endurance), and activity  tolerance impairments that are limiting ability to complete desired activities such as jogging, running, squatting, stairs, lunges, exercise, working without difficulty. Patient will benefit from skilled physical therapy intervention to address current body structure impairments and activity limitations to improve function and work towards goals set in current POC in order to return to prior level of function or maximal functional improvement.   OBJECTIVE IMPAIRMENTS: decreased activity tolerance, decreased balance, decreased coordination, decreased endurance, decreased knowledge of condition, decreased strength, increased fascial restrictions, impaired perceived functional ability, increased muscle spasms, impaired UE functional use, improper body mechanics, and pain.   ACTIVITY LIMITATIONS: squatting and stairs  PARTICIPATION LIMITATIONS: community activity, occupation, and   difficulty with jogging, running, squatting, stairs, lunges, exercise, working  PERSONAL FACTORS: Time since onset of injury/illness/exacerbation and 3+ comorbidities:   Angina pectoris (HCC); VT (ventricular tachycardia) (HCC); Near syncope; Ventricular tachycardia (HCC); Ventricular tachyarrhythmia (HCC); Mild intermittent asthma; basal cell carcinoma, and Chronic bilateral low back pain without sciatica on their problem  list. He has a past medical history of Allergy, Arthritis, Asthma, Colon polyp, Skin cancer (04/13/2016), SVT (supraventricular tachycardia), and Wears contact lenses. He  has a past surgical history that includes Colonoscopy; Image guided sinus surgery (N/A, 11/10/2016); Maxillary antrostomy (Bilateral, 11/10/2016); Ethmoidectomy (Bilateral, 11/10/2016); Frontal sinus exploration (Bilateral, 11/10/2016); Sphenoidectomy (Bilateral, 11/10/2016); LEFT HEART CATH AND CORONARY ANGIOGRAPHY (N/A, 05/30/2020); PVC ABLATION (N/A, 07/22/2020); Cardiac catheterization; Colonoscopy; and Colonoscopy with propofol (N/A, 04/16/2022) are also affecting patient's functional outcome.   REHAB POTENTIAL: Excellent  CLINICAL DECISION MAKING: Stable/uncomplicated  EVALUATION COMPLEXITY: Low   GOALS: Goals reviewed with patient? No  SHORT TERM GOALS: Target date: 09/03/2022  Patient will be independent with initial home exercise program for self-management of symptoms. Baseline: Initial HEP to be provided at visit 2 as appropriate (08/20/22); Goal status: In-progress   LONG TERM GOALS: Target date: 11/12/2022  Patient will be independent with a long-term home exercise program for self-management of symptoms.  Baseline: Initial HEP to be provided at visit 2 as appropriate (08/20/22); Goal status: In-progress  2.  Patient will demonstrate improved FOTO by equal or greater than 10 points by visit #10 to demonstrate improvement in overall condition and self-reported functional ability.  Baseline: 67 (08/20/22); Goal status: In-progress  3.  Patient will demonstrate the ability to perform single leg squat to touch buttocks on 18 inch chair with good form and no increase in pain afterwards to improve his ability to run, complete stairs, and squat for exercise.  Baseline: Depth to 45 degrees, bilateral unsteadiness, L > R, Left valgus, right patellar pain (08/20/22); Goal status: In-progress  4.  Patient will  report being able to jog during his 1 mile walk/jog without increased pain in his knees. Baseline: pain with initial steps and sometimes the entire bout of jogging (08/20/22); Goal status: In-progress  5.  Patient will complete community, work and/or recreational activities without limitation due to current condition.  Baseline: difficulty with jogging, running, squatting, stairs, lunges, exercise, working (08/20/22); Goal status: In-progress   PLAN:  PT FREQUENCY: 1-2x/week  PT DURATION: 12 weeks  PLANNED INTERVENTIONS: Therapeutic exercises, Therapeutic activity, Neuromuscular re-education, Balance training, Gait training, Patient/Family education, Self Care, Joint mobilization, Stair training, Dry Needling, Electrical stimulation, Spinal mobilization, Cryotherapy, Moist heat, Manual therapy, and Re-evaluation.  PLAN FOR NEXT SESSION: further examine shoulders and back when appropriate, practice improved squat and lunge form, update HEP as appropriate, progressive LE/Core/UE/postural/functional strengthening and ROM exercises. Education.    Cira Rue, PT, DPT 10/22/2022, 12:04  PM  [facility] [address] [place], [place] [address] P: [phone] I F: [phone]

## 2022-10-27 ENCOUNTER — Encounter: Payer: No Typology Code available for payment source | Admitting: Physical Therapy

## 2022-10-29 ENCOUNTER — Ambulatory Visit: Payer: No Typology Code available for payment source | Admitting: Physical Therapy

## 2022-11-03 ENCOUNTER — Encounter: Payer: No Typology Code available for payment source | Admitting: Physical Therapy

## 2022-11-05 ENCOUNTER — Encounter: Payer: No Typology Code available for payment source | Admitting: Physical Therapy

## 2022-11-10 ENCOUNTER — Encounter: Payer: No Typology Code available for payment source | Admitting: Physical Therapy

## 2022-11-12 ENCOUNTER — Ambulatory Visit: Payer: No Typology Code available for payment source | Admitting: Physical Therapy

## 2022-11-19 ENCOUNTER — Ambulatory Visit: Payer: No Typology Code available for payment source | Attending: Family Medicine | Admitting: Physical Therapy

## 2022-11-19 ENCOUNTER — Encounter: Payer: Self-pay | Admitting: Physical Therapy

## 2022-11-19 DIAGNOSIS — M25562 Pain in left knee: Secondary | ICD-10-CM | POA: Diagnosis present

## 2022-11-19 DIAGNOSIS — M25512 Pain in left shoulder: Secondary | ICD-10-CM | POA: Diagnosis present

## 2022-11-19 DIAGNOSIS — G8929 Other chronic pain: Secondary | ICD-10-CM | POA: Insufficient documentation

## 2022-11-19 DIAGNOSIS — M25511 Pain in right shoulder: Secondary | ICD-10-CM | POA: Insufficient documentation

## 2022-11-19 DIAGNOSIS — M25561 Pain in right knee: Secondary | ICD-10-CM | POA: Diagnosis present

## 2022-11-19 DIAGNOSIS — M5459 Other low back pain: Secondary | ICD-10-CM | POA: Diagnosis present

## 2022-11-19 NOTE — Therapy (Signed)
OUTPATIENT PHYSICAL THERAPY TREATMENT / PROGRESS NOTE / RE-CERTIFICATION Dates of reporting from 08/20/2022 to 11/19/2022    Patient Name: Kyle Kim MRN: [id number] DOB:03/15/1960, 62 y.o., male Today's Date: 11/19/2022  END OF SESSION:  PT End of Session - 11/19/22 1952     Visit Number 7    Number of Visits 13    Date for PT Re-Evaluation 11/12/22    Authorization Type [employer] reporting period from 08/20/2022    Authorization Time Period VL 60 PT/OT/ST per cal yr    Authorization - Visit Number 7    Authorization - Number of Visits 60    Progress Note Due on Visit 10    PT Start Time 1037    PT Stop Time 1115    PT Time Calculation (min) 38 min    Activity Tolerance Patient tolerated treatment well    Behavior During Therapy [facility] for tasks assessed/performed               Past Medical History:  Diagnosis Date   Allergy    Arthritis    lower back   Asthma    Colon polyp    tubular adenoma   Skin cancer 04/13/2016   left neck   SVT (supraventricular tachycardia) (HCC)    Wears contact lenses    Past Surgical History:  Procedure Laterality Date   CARDIAC CATHETERIZATION     COLONOSCOPY     COLONOSCOPY     x3   COLONOSCOPY WITH PROPOFOL N/A 04/16/2022   Procedure: COLONOSCOPY WITH PROPOFOL;  Surgeon: Jaynie Collins, DO;  Location: [facility] ENDOSCOPY;  Service: Gastroenterology;  Laterality: N/A;   ETHMOIDECTOMY Bilateral 11/10/2016   Procedure: ETHMOIDECTOMY;  Surgeon: Geanie Logan, MD;  Location: [facility] SURGERY CNTR;  Service: ENT;  Laterality: Bilateral;   FRONTAL SINUS EXPLORATION Bilateral 11/10/2016   Procedure: FRONTAL SINUS EXPLORATION;  Surgeon: Geanie Logan, MD;  Location: [facility] SURGERY CNTR;  Service: ENT;  Laterality: Bilateral;   IMAGE GUIDED SINUS SURGERY N/A 11/10/2016   Procedure: IMAGE GUIDED SINUS SURGERY;  Surgeon: Geanie Logan, MD;  Location: [facility] SURGERY CNTR;  Service: ENT;  Laterality: N/A;  GAVE DISK TO [facility] 10-23    LEFT HEART CATH AND CORONARY ANGIOGRAPHY N/A 05/30/2020   Procedure: LEFT HEART CATH AND CORONARY ANGIOGRAPHY;  Surgeon: Antonieta Iba, MD;  Location: [facility] INVASIVE CV LAB;  Service: Cardiovascular;  Laterality: N/A;   MAXILLARY ANTROSTOMY Bilateral 11/10/2016   Procedure: MAXILLARY ANTROSTOMY WITH TISSUE REMOVAL;  Surgeon: Geanie Logan, MD;  Location: [facility] SURGERY CNTR;  Service: ENT;  Laterality: Bilateral;   PVC ABLATION N/A 07/22/2020   Procedure: PVC ABLATION;  Surgeon: Lanier Prude, MD;  Location: [facility] INVASIVE CV LAB;  Service: Cardiovascular;  Laterality: N/A;   SPHENOIDECTOMY Bilateral 11/10/2016   Procedure: SPHENOIDECTOMY;  Surgeon: Geanie Logan, MD;  Location: [facility] SURGERY CNTR;  Service: ENT;  Laterality: Bilateral;   Patient Active Problem List   Diagnosis Date Noted   Mild intermittent asthma 07/23/2022   Chronic bilateral low back pain without sciatica 07/23/2022   Ventricular tachyarrhythmia (HCC) 12/09/2020   Ventricular tachycardia (HCC) 07/22/2020   Angina pectoris (HCC) 05/30/2020   VT (ventricular tachycardia) (HCC) 05/30/2020   Near syncope 05/30/2020    PCP: Sherlyn Hay, DO  REFERRING PROVIDER: Sherlyn Hay, DO  REFERRING DIAG: chronic bilateral low back pain without sciatica, chronic pain of both shoulders, bilateral chronic knee pain  Rationale for Evaluation and Treatment: Rehabilitation  THERAPY DIAG:  Chronic pain of both  knees  Other low back pain  Bilateral shoulder pain, unspecified chronicity  ONSET DATE: knee pain and back pain in his 75s, chronic  PERTINENT HISTORY:  Patient is a 62 y.o. male who presents to outpatient physical therapy with a referral for medical diagnosis chronic bilateral low back pain without sciatica, chronic pain of both shoulders, bilateral chronic knee pain. This patient's chief complaints consist of bilateral chronic knee pain, chronic low back pain, and bilateral shoulder pain leading to the  following functional deficits: difficulty with jogging, running, squatting, stairs, lunges, exercise, working. Relevant past medical history and comorbidities include Angina pectoris (HCC); VT (ventricular tachycardia) (HCC); Near syncope; Ventricular tachycardia (HCC); Ventricular tachyarrhythmia (HCC); Mild intermittent asthma; basal cell carcinoma, and Chronic bilateral low back pain without sciatica on their problem list. He has a past medical history of Allergy, Arthritis, Asthma, Colon polyp, Skin cancer (04/13/2016), SVT (supraventricular tachycardia), and Wears contact lenses. He  has a past surgical history that includes Colonoscopy; Image guided sinus surgery (N/A, 11/10/2016); Maxillary antrostomy (Bilateral, 11/10/2016); Ethmoidectomy (Bilateral, 11/10/2016); Frontal sinus exploration (Bilateral, 11/10/2016); Sphenoidectomy (Bilateral, 11/10/2016); LEFT HEART CATH AND CORONARY ANGIOGRAPHY (N/A, 05/30/2020); PVC ABLATION (N/A, 07/22/2020); Cardiac catheterization; Colonoscopy; and Colonoscopy with propofol (N/A, 04/16/2022). Patient denies hx of stroke, seizures, diabetes, unexplained weight loss, unexplained changes in bowel or bladder problems, unexplained stumbling or dropping things, osteoporosis, and spinal surgery  SUBJECTIVE:                                                                                                                                                                                           SUBJECTIVE STATEMENT:   Patient states he had his heart procedure Wednesday 2 weeks ago. He is not sure if it worked, but he is no worse. He has been cleared of all restrictions related to the procedure by his doctor. He has his next appointment with cardiologist on 12/15/2022 to do an EKG and see what updates there are. He states he has been doing great but has not been able to exercise or work, so he has no pain. He thinks the left hip pain is because of how he has to drive to reach the  breaks. After 2 weeks of not doing that decreased his hip pain. He states he went back to work last Friday and it is flairing up a little now. He would like to do something for shoulder pain today.   Patient states his right shoulder started hurting when he was 15. He had to stop his last year of baseball because of shoulder pain. (Referred to visit on 08/27/2022 for history of shoulder pain). He has  not noticed any recent worsening of shoulder pain, but he has a cycle of pain. He does have a little bit more shoulder pain the last 2 days which is about right for him because it has been about 2 weeks since he did his shoulder exercises (following with cardiac procedure). HE plans to start back up on Monday. He would like PT to look at his shoulders and assess his routine to help improve his pain. He is right handed.   Patient reports the following usual shoulder exercises: 6 sets total (1 set of each thing, every other day). Sets range from 10-16 except as below.  - small arm circles, large arm circles.  - arnold press with 3# weights - lateral raise with 3# weights - front raise with 3# weights - ER at 90/90 position 3-8# - pulse at 90/90 position 3-8# (20-25 reps)  PAIN: Right shoulder:  Are you having pain? yes NPRS: Current: 0/10,  Best: 0/10, Worst: 4/10. Pain location: superolateral deltoid region Pain description: stabbing (short lasting), pops sometimes, denies paresthesia, denies pain in neck or in distal arm. Aggravating factors: throwing side arm (he avoids), not doing his exercise routine, random things like turning the wheel of the car, reaching for something at a weird angle.  Relieving factors: doing his exercise routine consistently   Left shoulder:  Are you having pain? Yes NPRS: Current: 0/10 at rest 1/10,  Best: 0/10, Worst: 4-5/10. Pain location: superolateral deltoid region Pain description: stabbing, not as sharp as the right shoulder, (once it starts hurting, will hurt  the rest of the day), denies popping, denies paresthesia, denies pain in neck or in distal arm.  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   OBJECTIVE  SELF- REPORTED FUNCTION FOTO score: 67/100 (knee questionnaire)  OBSERVATION/INSPECTION Posture mild forward head, rounded shoulders.  Anthropometrics Tremor: none Muscle bulk: good throughout body Edema: mildly increased volume at B lower legs.   PERIPHERAL JOINT MOTION (in degrees) ACTIVE RANGE OF MOTION (AROM) *Indicates pain 11/19/22 Date Date  Joint/Motion R/L R/L R/L  Shoulder     Flexion 162/156* / /  Extension 55*/46 / /  Abduction  175*/155* / /  External rotation 114/113* / /  Internal rotation T9/T8 / /  Comments:  11/19/2022: R shoulder abduction end range pain. L shoulder flexion and abduction end range pain. B elbows, wrists, hands grossly [facility] for basic mobility.   PASSIVE RANGE OF MOTION (PROM) 11/19/2022: Pain R shoulder with flexion overpressure (top of shoulder), abduction overpressure (posterior inferior shoulder), ER. Pain L shoulder with ER and IR ovepressure   MUSCLE PERFORMANCE (MMT):  *Indicates pain 11/19/22 Date Date  Joint/Motion R/L R/L R/L  Shoulder     Flexion 5/5 / /  Abduction (C5) 4+*/4* / /  External rotation 4+*/4+* / /  Internal rotation 5/5 / /  Elbow     Flexion (C6) 5/5 / /   SPECIAL TESTS:  SHOULDER SPECIAL TESTS Painful arc test: R = negative, L = negative. Drop arm test: R = negative, L = negative. Hawkins-Kennedy test: R = positive, L = positive posterior shld pain. Infraspinatus test: R =  positive for pain, L =  positive for pain. Neer's test: R = positive, L =  negative O'Brien's test: R = negative, L = negative Anterior/Posterior slide and circumduction: R = negative; L = negative   TODAY'S TREATMENT:  Therapeutic exercise: to centralize symptoms and improve ROM, strength, muscular endurance, and activity tolerance required for successful completion of functional activities.  - examination of bilateral shoulder pain (see subjective and objective above).  - wall walks with isometric ER with RTB around hands, 1x10  - Education on HEP including handout   Pt required multimodal cuing for proper technique and to facilitate improved neuromuscular control, strength, range of motion, and functional ability resulting in improved performance and form.  PATIENT EDUCATION:  Education details: Exercise purpose/form. Self management techniques.  Reviewed cancelation/no-show policy with patient and confirmed patient has phone number for clinic; patient verbalized understanding (08/20/22). Person educated: Patient Education method: Explanation, Demonstration, Tactile cues, and Verbal cues Education comprehension: verbalized understanding, returned demonstration, and needs further education  HOME EXERCISE PROGRAM: Updated portions of patient's previous exercise program verbally (added 10# to squats, recommended 3x10 squats, GTB for standing hip abduction, extension, and flexion).   Access Code: [email or web address] URL: [email or web address] Date: 11/19/2022 Prepared by: Norton Blizzard  Exercises - Standing Hip Flexion with Counter Support  - 3-4 x weekly - 3 sets - 10 reps - 1 second hold - Supine Hip Flexion with Resistance Loop  - 3-4 x weekly - 3 sets - 10 reps - 1 second hold - Single Leg Running Balance  - 3-7 x weekly - 3 sets - 10 reps - Forearm Walks on Wall with Resistance Band  - 3-4 x weekly - 3 sets - 10 reps  ASSESSMENT:  CLINICAL IMPRESSION:   Patient has attended 7 physical therapy  sessions since starting current episode of care on 08/20/2022. Patient has not been able to attend more than 1x a week and has missed several visits due to being unavailable and most recently so he could undergo a heart ablation on 11/04/22. He is now returning to PT after being cleared of activity precautions. He has had some success with improving knee and hip pain, but would like to focus on his shoulder pain now. Therefore, today's session focused on examination of bilateral shoulder pain. He appears to have chronic bilateral shoulder pain most likely associated with rotator cuff related pain. He has improved pain with consistent performance of self-created HEP that upon review would benefit from updates including more appropriate loading to improve strength and resilience. Plan to focus on updating HEP over next few visits to improve effectiveness of his program. Patient has been continuing to participate in HEP for his lower body except the last two weeks when he was unable to for that time after his heart procedure. Prior to his heart procedure, he reports improved knee pain with walking/jogging 1 mile, and he estimates 25% improvement in LE since starting PT. His knee FOTO score is unchanged. Patient would benefit from extension of his plan of care to address his bilateral shoulder pain and update back and LE programs as needed. Plan for 1-2x per week for up to 12 more weeks. Patient would benefit from continued management of limiting condition by skilled physical therapist to address remaining impairments and functional limitations to work towards stated goals and return to PLOF or maximal functional independence.   From initial PT evaluation 08/20/2022:  Patient is a 62 y.o. male referred to outpatient physical therapy with a medical diagnosis  of chronic bilateral low back pain without sciatica, chronic pain of both shoulders, bilateral chronic knee pain who presents with signs and symptoms consistent with  chronic bilateral knee pain, L > R, chronic low back pain, and chronic bilaterally shoulder pain. Today's session focused on knee pain per patient request. 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 Patient also TTP at left patellar tendon and with positive patellar grind test on the right. He demonstrates heavy heel strike in jogging form with mild dynamic valgus on the left and likely overstride bilaterally. He has some knee discomfort bilaterally with squatting and lunging with ipsilateral anterior hip pain with lunging. He demonstrates weight shift posteriorly and to the right with bilateral squats and unsteadiness on feet and limited depth with single leg squats. Any of these objective features could contribute to the difficulty patient is having completing his desired activities without pain and limitation. Lumbar spine screen demonstrated no evidence of nerve root or upper motor neuron involvement. Shoulder examination deferred to later date. Patient presents with significant pain, balance, motor control, edema, running form, squat form, muscle performance (strength/power/endurance), and activity tolerance impairments that are limiting ability to complete desired activities such as jogging, running, squatting, stairs, lunges, exercise, working without difficulty. Patient will benefit from skilled physical therapy intervention to address current body structure impairments and activity limitations to improve function and work towards goals set in current POC in order to return to prior level of function or maximal functional improvement.   OBJECTIVE IMPAIRMENTS: decreased activity tolerance, decreased balance, decreased coordination, decreased endurance, decreased knowledge of condition, decreased strength, increased fascial  restrictions, impaired perceived functional ability, increased muscle spasms, impaired UE functional use, improper body mechanics, and pain.   ACTIVITY LIMITATIONS: squatting and stairs  PARTICIPATION LIMITATIONS: community activity, occupation, and   difficulty with jogging, running, squatting, stairs, lunges, exercise, working  PERSONAL FACTORS: Time since onset of injury/illness/exacerbation and 3+ comorbidities:   Angina pectoris (HCC); VT (ventricular tachycardia) (HCC); Near syncope; Ventricular tachycardia (HCC); Ventricular tachyarrhythmia (HCC); Mild intermittent asthma; basal cell carcinoma, and Chronic bilateral low back pain without sciatica on their problem list. He has a past medical history of Allergy, Arthritis, Asthma, Colon polyp, Skin cancer (04/13/2016), SVT (supraventricular tachycardia), and Wears contact lenses. He  has a past surgical history that includes Colonoscopy; Image guided sinus surgery (N/A, 11/10/2016); Maxillary antrostomy (Bilateral, 11/10/2016); Ethmoidectomy (Bilateral, 11/10/2016); Frontal sinus exploration (Bilateral, 11/10/2016); Sphenoidectomy (Bilateral, 11/10/2016); LEFT HEART CATH AND CORONARY ANGIOGRAPHY (N/A, 05/30/2020); PVC ABLATION (N/A, 07/22/2020); Cardiac catheterization; Colonoscopy; and Colonoscopy with propofol (N/A, 04/16/2022) are also affecting patient's functional outcome.   REHAB POTENTIAL: Excellent  CLINICAL DECISION MAKING: Stable/uncomplicated  EVALUATION COMPLEXITY: Low   GOALS: Goals reviewed with patient? No  SHORT TERM GOALS: Target date: 09/03/2022  Patient will be independent with initial home exercise program for self-management of symptoms. Baseline: Initial HEP to be provided at visit 2 as appropriate (08/20/22); Goal status: MET   LONG TERM GOALS: Target date: 11/12/2022. Target date updated to 02/11/2023 for all unmet goals on 11/19/2022.   Patient will be independent with a long-term home exercise program for  self-management of symptoms.  Baseline: Initial HEP to be provided at visit 2 as appropriate (08/20/22); has not been participating the last 2 weeks after heart procedure, participating well prior to that, now updated to include shoulder exercises (11/19/2022);  Goal  status: In-progress  2.  Patient will demonstrate improved FOTO by equal or greater than 10 points by visit #10 to demonstrate improvement in overall condition and self-reported functional ability.  Baseline: 67 (08/20/22); 67 at visit #7 (11/19/2022);  Goal status: ongoing  3.  Patient will demonstrate the ability to perform single leg squat to touch buttocks on 18 inch chair with good form and no increase in pain afterwards to improve his ability to run, complete stairs, and squat for exercise.  Baseline: Depth to 45 degrees, bilateral unsteadiness, L > R, Left valgus, right patellar pain (08/20/22); not tested in leu of UE testing (11/19/2022);  Goal status: ongoing  4.  Patient will report being able to jog during his 1 mile walk/jog without increased pain in his knees. Baseline: pain with initial steps and sometimes the entire bout of jogging (08/20/22); getting better prior to heart procedure, felt good this morning (11/19/2022);  Goal status: In-progress  5.  Patient will complete community, work and/or recreational activities without limitation due to current condition.  Baseline: difficulty with jogging, running, squatting, stairs, lunges, exercise, working (08/20/22); 25% improvement in legs (11/19/2022);  Goal status: In-progress   PLAN:  PT FREQUENCY: 1-2x/week  PT DURATION: 12 weeks  PLANNED INTERVENTIONS: Therapeutic exercises, Therapeutic activity, Neuromuscular re-education, Balance training, Gait training, Patient/Family education, Self Care, Joint mobilization, Stair training, Dry Needling, Electrical stimulation, Spinal mobilization, Cryotherapy, Moist heat, Manual therapy, and Re-evaluation.  PLAN FOR NEXT  SESSION: further examine shoulders and back when appropriate, practice improved squat and lunge form, update HEP as appropriate, progressive LE/Core/UE/postural/functional strengthening and ROM exercises. Education.    Cira Rue, PT, DPT 11/19/2022, 8:09 PM  [facility] [address] [place], [place] [address] P: [phone] I F: [phone]

## 2022-11-26 ENCOUNTER — Encounter: Payer: Self-pay | Admitting: Physical Therapy

## 2022-11-26 ENCOUNTER — Ambulatory Visit: Payer: No Typology Code available for payment source | Admitting: Physical Therapy

## 2022-11-26 DIAGNOSIS — G8929 Other chronic pain: Secondary | ICD-10-CM

## 2022-11-26 DIAGNOSIS — M5459 Other low back pain: Secondary | ICD-10-CM

## 2022-11-26 DIAGNOSIS — M25512 Pain in left shoulder: Secondary | ICD-10-CM

## 2022-11-26 DIAGNOSIS — M25561 Pain in right knee: Secondary | ICD-10-CM | POA: Diagnosis not present

## 2022-11-26 NOTE — Therapy (Signed)
OUTPATIENT PHYSICAL THERAPY TREATMENT    Patient Name: Kyle Kim MRN: [id number] DOB:01/29/1960, 62 y.o., male Today's Date: 11/26/2022  END OF SESSION:  PT End of Session - 11/26/22 1055     Visit Number 8    Number of Visits 13    Date for PT Re-Evaluation 11/12/22    Authorization Type [employer] reporting period from 02/11/2023    Authorization Time Period VL 60 PT/OT/ST per cal yr    Authorization - Visit Number 8    Authorization - Number of Visits 60    Progress Note Due on Visit 10    PT Start Time 1035    PT Stop Time 1113    PT Time Calculation (min) 38 min    Activity Tolerance Patient tolerated treatment well    Behavior During Therapy [facility] for tasks assessed/performed                Past Medical History:  Diagnosis Date   Allergy    Arthritis    lower back   Asthma    Colon polyp    tubular adenoma   Skin cancer 04/13/2016   left neck   SVT (supraventricular tachycardia) (HCC)    Wears contact lenses    Past Surgical History:  Procedure Laterality Date   CARDIAC CATHETERIZATION     COLONOSCOPY     COLONOSCOPY     x3   COLONOSCOPY WITH PROPOFOL N/A 04/16/2022   Procedure: COLONOSCOPY WITH PROPOFOL;  Surgeon: Jaynie Collins, DO;  Location: [facility] ENDOSCOPY;  Service: Gastroenterology;  Laterality: N/A;   ETHMOIDECTOMY Bilateral 11/10/2016   Procedure: ETHMOIDECTOMY;  Surgeon: Geanie Logan, MD;  Location: [facility] SURGERY CNTR;  Service: ENT;  Laterality: Bilateral;   FRONTAL SINUS EXPLORATION Bilateral 11/10/2016   Procedure: FRONTAL SINUS EXPLORATION;  Surgeon: Geanie Logan, MD;  Location: [facility] SURGERY CNTR;  Service: ENT;  Laterality: Bilateral;   IMAGE GUIDED SINUS SURGERY N/A 11/10/2016   Procedure: IMAGE GUIDED SINUS SURGERY;  Surgeon: Geanie Logan, MD;  Location: [facility];  Service: ENT;  Laterality: N/A;  GAVE DISK TO [facility] 10-23   LEFT HEART CATH AND CORONARY ANGIOGRAPHY N/A 05/30/2020   Procedure: LEFT HEART  CATH AND CORONARY ANGIOGRAPHY;  Surgeon: Antonieta Iba, MD;  Location: [facility] INVASIVE CV LAB;  Service: Cardiovascular;  Laterality: N/A;   MAXILLARY ANTROSTOMY Bilateral 11/10/2016   Procedure: MAXILLARY ANTROSTOMY WITH TISSUE REMOVAL;  Surgeon: Geanie Logan, MD;  Location: [facility] SURGERY CNTR;  Service: ENT;  Laterality: Bilateral;   PVC ABLATION N/A 07/22/2020   Procedure: PVC ABLATION;  Surgeon: Lanier Prude, MD;  Location: [facility] INVASIVE CV LAB;  Service: Cardiovascular;  Laterality: N/A;   SPHENOIDECTOMY Bilateral 11/10/2016   Procedure: SPHENOIDECTOMY;  Surgeon: Geanie Logan, MD;  Location: [facility];  Service: ENT;  Laterality: Bilateral;   Patient Active Problem List   Diagnosis Date Noted   Mild intermittent asthma 07/23/2022   Chronic bilateral low back pain without sciatica 07/23/2022   Ventricular tachyarrhythmia (HCC) 12/09/2020   Ventricular tachycardia (HCC) 07/22/2020   Angina pectoris (HCC) 05/30/2020   VT (ventricular tachycardia) (HCC) 05/30/2020   Near syncope 05/30/2020    PCP: Sherlyn Hay, DO  REFERRING PROVIDER: Sherlyn Hay, DO  REFERRING DIAG: chronic bilateral low back pain without sciatica, chronic pain of both shoulders, bilateral chronic knee pain  Rationale for Evaluation and Treatment: Rehabilitation  THERAPY DIAG:  Chronic pain of both knees  Other low back pain  Bilateral shoulder pain, unspecified  chronicity  ONSET DATE: knee pain and back pain in his 41s, chronic  PERTINENT HISTORY:  Patient is a 62 y.o. male who presents to outpatient physical therapy with a referral for medical diagnosis chronic bilateral low back pain without sciatica, chronic pain of both shoulders, bilateral chronic knee pain. This patient's chief complaints consist of bilateral chronic knee pain, chronic low back pain, and bilateral shoulder pain leading to the following functional deficits: difficulty with jogging, running, squatting, stairs,  lunges, exercise, working. Relevant past medical history and comorbidities include Angina pectoris (HCC); VT (ventricular tachycardia) (HCC); Near syncope; Ventricular tachycardia (HCC); Ventricular tachyarrhythmia (HCC); Mild intermittent asthma; basal cell carcinoma, and Chronic bilateral low back pain without sciatica on their problem list. He has a past medical history of Allergy, Arthritis, Asthma, Colon polyp, Skin cancer (04/13/2016), SVT (supraventricular tachycardia), and Wears contact lenses. He  has a past surgical history that includes Colonoscopy; Image guided sinus surgery (N/A, 11/10/2016); Maxillary antrostomy (Bilateral, 11/10/2016); Ethmoidectomy (Bilateral, 11/10/2016); Frontal sinus exploration (Bilateral, 11/10/2016); Sphenoidectomy (Bilateral, 11/10/2016); LEFT HEART CATH AND CORONARY ANGIOGRAPHY (N/A, 05/30/2020); PVC ABLATION (N/A, 07/22/2020); Cardiac catheterization; Colonoscopy; and Colonoscopy with propofol (N/A, 04/16/2022). Patient denies hx of stroke, seizures, diabetes, unexplained weight loss, unexplained changes in bowel or bladder problems, unexplained stumbling or dropping things, osteoporosis, and spinal surgery  SUBJECTIVE:                                                                                                                                                                                           SUBJECTIVE STATEMENT:   Patient states he has a new pain. When he was trying to curl 20#, he suddenly had a pain in the right anterior shoulder on Saturday and yesterday, so he had to cut back 15#. He usually uses 20# on his last set of curls. He has not noticed this pain any other time and it goes away afterwards. He has not been finishing his last set of curls that are usually 15 reps at 20#. He jogged some this morning and his new shoulder exercise is going well except he only did one set two times since last week.   Patient reports the following usual shoulder  exercises: 6 sets total (1 set of each thing, every other day). Sets range from 10-16 except as below.  - small arm circles, large arm circles.  - arnold press with 3# weights - lateral raise with 3# weights - front raise with 3# weights - ER at 90/90 position 3-8# - overhead pulse at 90/90 position 3-8# (20-25 reps)  PAIN: NPRS: 0/10  PRECAUTIONS: None  PATIENT GOALS: "hoping to get  some extra exercises to add to his routine" "maybe I can get a little improvement" His next short term goal is to jog the whole mile loop without walking.   OBJECTIVE  TODAY'S TREATMENT:  Therapeutic exercise: to centralize symptoms and improve ROM, strength, muscular endurance, and activity tolerance required for successful completion of functional activities.  - Upper body ergometer level 9 to encourage joint nutrition, warm tissue, induce analgesic effect of aerobic exercise, improve muscular strength and endurance,  and prepare for remainder of session. 5 min forwards/backwards.  - wall walks with isometric ER with GTB around hands, 3x10 with 1 min break between sets (could not stand too far from wall because it caused right thumb pain).   Circuit (all standing): - arnold press with 5# DB, 3x10 - lateral raise palm down with 5# DB, 3x10 (fatigue) - front raise thumb up with 5# DB, 3x10 - ER at 90/90 position 5# DB, 3x10 - bicep curls, 1x8-10 with 15# each side (non lasting pain at right long head biceps tendon), 1x10 each side with 8# (no pain), 1x10 each side tempo 2-0-2 with 15#.   Pt required multimodal cuing for proper technique and to facilitate improved neuromuscular control, strength, range of motion, and functional ability resulting in improved performance and form.  PATIENT EDUCATION:  Education details: Exercise purpose/form. Self management techniques.  Reviewed cancelation/no-show policy with patient and confirmed patient has phone number for clinic; patient verbalized understanding  (08/20/22). Person educated: Patient Education method: Explanation, Demonstration, Tactile cues, and Verbal cues Education comprehension: verbalized understanding, returned demonstration, and needs further education  HOME EXERCISE PROGRAM: Updated portions of patient's previous exercise program verbally (added 10# to squats, recommended 3x10 squats, GTB for standing hip abduction, extension, and flexion).   Access Code: [email or web address] URL: [email or web address] Date: 11/19/2022 Prepared by: Norton Blizzard  Exercises - Standing Hip Flexion with Counter Support  - 3-4 x weekly - 3 sets - 10 reps - 1 second hold - Supine Hip Flexion with Resistance Loop  - 3-4 x weekly - 3 sets - 10 reps - 1 second hold - Single Leg Running Balance  - 3-7 x weekly - 3 sets - 10 reps - Forearm Walks on Wall with Resistance Band  - 3-4 x weekly - 3 sets - 10 reps  ASSESSMENT:  CLINICAL IMPRESSION:   Patient appears to have some R biceps tendon pain when returning to exercise program he was off from for 3 weeks. Patient advised to drop weight to amount that does not provoke that pain while ramping back up to his previous biceps curl exercises. Today's session focused on shoulder girdle strengthening and progressing his pre-existing shoulder routine to be better aligned with recommendations for strength training. He felt strong (appropriate) fatigue with wall walk exercise. Patient would benefit from continued management of limiting condition by skilled physical therapist to address remaining impairments and functional limitations to work towards stated goals and return to PLOF or maximal functional independence.   From initial PT evaluation 08/20/2022:  Patient is a 62 y.o. male referred to outpatient physical therapy with a medical diagnosis of chronic bilateral low back pain without sciatica, chronic pain of both shoulders, bilateral chronic knee pain who presents with signs and symptoms consistent with  chronic bilateral knee pain, L > R, chronic low back pain, and chronic bilaterally shoulder pain. Today's session focused on knee pain per patient request. 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 Patient also TTP at left patellar tendon and with positive patellar grind test on the right. He demonstrates heavy heel strike in jogging form with mild dynamic valgus on the left and likely overstride bilaterally. He has some knee discomfort bilaterally with squatting and lunging with ipsilateral anterior hip pain with lunging. He demonstrates weight shift posteriorly and to the right with bilateral squats and unsteadiness on feet and limited depth with single leg squats. Any of these objective features could contribute to the difficulty patient is having completing his desired activities without pain and limitation. Lumbar spine screen demonstrated no evidence of nerve root or upper motor neuron involvement. Shoulder examination deferred to later date. Patient presents with significant pain, balance, motor control, edema, running form, squat form, muscle performance (strength/power/endurance), and activity tolerance impairments that are limiting ability to complete desired activities such as jogging, running, squatting, stairs, lunges, exercise, working without difficulty. Patient will benefit from skilled physical therapy intervention to address current body structure impairments and activity limitations to improve function and work towards goals set in current POC in order to return to prior level of function or maximal functional improvement.   OBJECTIVE IMPAIRMENTS: decreased activity tolerance, decreased balance, decreased coordination, decreased endurance, decreased knowledge of condition, decreased strength, increased fascial  restrictions, impaired perceived functional ability, increased muscle spasms, impaired UE functional use, improper body mechanics, and pain.   ACTIVITY LIMITATIONS: squatting and stairs  PARTICIPATION LIMITATIONS: community activity, occupation, and   difficulty with jogging, running, squatting, stairs, lunges, exercise, working  PERSONAL FACTORS: Time since onset of injury/illness/exacerbation and 3+ comorbidities:   Angina pectoris (HCC); VT (ventricular tachycardia) (HCC); Near syncope; Ventricular tachycardia (HCC); Ventricular tachyarrhythmia (HCC); Mild intermittent asthma; basal cell carcinoma, and Chronic bilateral low back pain without sciatica on their problem list. He has a past medical history of Allergy, Arthritis, Asthma, Colon polyp, Skin cancer (04/13/2016), SVT (supraventricular tachycardia), and Wears contact lenses. He  has a past surgical history that includes Colonoscopy; Image guided sinus surgery (N/A, 11/10/2016); Maxillary antrostomy (Bilateral, 11/10/2016); Ethmoidectomy (Bilateral, 11/10/2016); Frontal sinus exploration (Bilateral, 11/10/2016); Sphenoidectomy (Bilateral, 11/10/2016); LEFT HEART CATH AND CORONARY ANGIOGRAPHY (N/A, 05/30/2020); PVC ABLATION (N/A, 07/22/2020); Cardiac catheterization; Colonoscopy; and Colonoscopy with propofol (N/A, 04/16/2022) are also affecting patient's functional outcome.   REHAB POTENTIAL: Excellent  CLINICAL DECISION MAKING: Stable/uncomplicated  EVALUATION COMPLEXITY: Low   GOALS: Goals reviewed with patient? No  SHORT TERM GOALS: Target date: 09/03/2022  Patient will be independent with initial home exercise program for self-management of symptoms. Baseline: Initial HEP to be provided at visit 2 as appropriate (08/20/22); Goal status: MET   LONG TERM GOALS: Target date: 11/12/2022. Target date updated to 02/11/2023 for all unmet goals on 11/19/2022.   Patient will be independent with a long-term home exercise program for  self-management of symptoms.  Baseline: Initial HEP to be provided at visit 2 as appropriate (08/20/22); has not been participating the last 2 weeks after heart procedure, participating well prior to that, now updated to include shoulder exercises (11/19/2022);  Goal status: In-progress  2.  Patient will demonstrate improved FOTO by equal or greater than 10 points by visit #10 to demonstrate improvement in overall condition and self-reported functional ability.  Baseline: 67 (08/20/22); 67 at visit #7 (11/19/2022);  Goal status: ongoing  3.  Patient will demonstrate the ability to perform single leg squat to touch buttocks on 18 inch chair with good form and no increase  in pain afterwards to improve his ability to run, complete stairs, and squat for exercise.  Baseline: Depth to 45 degrees, bilateral unsteadiness, L > R, Left valgus, right patellar pain (08/20/22); not tested in leu of UE testing (11/19/2022);  Goal status: ongoing  4.  Patient will report being able to jog during his 1 mile walk/jog without increased pain in his knees. Baseline: pain with initial steps and sometimes the entire bout of jogging (08/20/22); getting better prior to heart procedure, felt good this morning (11/19/2022);  Goal status: In-progress  5.  Patient will complete community, work and/or recreational activities without limitation due to current condition.  Baseline: difficulty with jogging, running, squatting, stairs, lunges, exercise, working (08/20/22); 25% improvement in legs (11/19/2022);  Goal status: In-progress   PLAN:  PT FREQUENCY: 1-2x/week  PT DURATION: 12 weeks  PLANNED INTERVENTIONS: Therapeutic exercises, Therapeutic activity, Neuromuscular re-education, Balance training, Gait training, Patient/Family education, Self Care, Joint mobilization, Stair training, Dry Needling, Electrical stimulation, Spinal mobilization, Cryotherapy, Moist heat, Manual therapy, and Re-evaluation.  PLAN FOR NEXT  SESSION: further examine shoulders and back when appropriate, practice improved squat and lunge form, update HEP as appropriate, progressive LE/Core/UE/postural/functional strengthening and ROM exercises. Education.    Cira Rue, PT, DPT 11/26/2022, 11:17 AM  [facility] [address] [place], [place] [address] P: [phone] I F: [phone]

## 2022-12-03 ENCOUNTER — Ambulatory Visit: Payer: No Typology Code available for payment source | Admitting: Physical Therapy

## 2022-12-03 ENCOUNTER — Encounter: Payer: Self-pay | Admitting: Physical Therapy

## 2022-12-03 DIAGNOSIS — M25511 Pain in right shoulder: Secondary | ICD-10-CM

## 2022-12-03 DIAGNOSIS — G8929 Other chronic pain: Secondary | ICD-10-CM

## 2022-12-03 DIAGNOSIS — M25561 Pain in right knee: Secondary | ICD-10-CM | POA: Diagnosis not present

## 2022-12-03 DIAGNOSIS — M5459 Other low back pain: Secondary | ICD-10-CM

## 2022-12-03 NOTE — Therapy (Addendum)
OUTPATIENT PHYSICAL THERAPY TREATMENT    Patient Name: Kyle Kim MRN: [id number] DOB:1960/11/15, 62 y.o., male Today's Date: 12/03/2022  END OF SESSION:  PT End of Session - 12/03/22 1103     Visit Number 9    Number of Visits 13    Date for PT Re-Evaluation 11/12/22    Authorization Type [employer] reporting period from 02/11/2023    Authorization Time Period VL 60 PT/OT/ST per cal yr    Authorization - Visit Number 9    Authorization - Number of Visits 60    Progress Note Due on Visit 10    PT Start Time 1034    PT Stop Time 1124    PT Time Calculation (min) 50 min    Activity Tolerance Patient tolerated treatment well    Behavior During Therapy [facility] for tasks assessed/performed             Past Medical History:  Diagnosis Date   Allergy    Arthritis    lower back   Asthma    Colon polyp    tubular adenoma   Skin cancer 04/13/2016   left neck   SVT (supraventricular tachycardia) (HCC)    Wears contact lenses    Past Surgical History:  Procedure Laterality Date   CARDIAC CATHETERIZATION     COLONOSCOPY     COLONOSCOPY     x3   COLONOSCOPY WITH PROPOFOL N/A 04/16/2022   Procedure: COLONOSCOPY WITH PROPOFOL;  Surgeon: Jaynie Collins, DO;  Location: [facility] ENDOSCOPY;  Service: Gastroenterology;  Laterality: N/A;   ETHMOIDECTOMY Bilateral 11/10/2016   Procedure: ETHMOIDECTOMY;  Surgeon: Geanie Logan, MD;  Location: [facility] SURGERY CNTR;  Service: ENT;  Laterality: Bilateral;   FRONTAL SINUS EXPLORATION Bilateral 11/10/2016   Procedure: FRONTAL SINUS EXPLORATION;  Surgeon: Geanie Logan, MD;  Location: [facility] SURGERY CNTR;  Service: ENT;  Laterality: Bilateral;   IMAGE GUIDED SINUS SURGERY N/A 11/10/2016   Procedure: IMAGE GUIDED SINUS SURGERY;  Surgeon: Geanie Logan, MD;  Location: [facility] SURGERY CNTR;  Service: ENT;  Laterality: N/A;  GAVE DISK TO [facility] 10-23   LEFT HEART CATH AND CORONARY ANGIOGRAPHY N/A 05/30/2020   Procedure: LEFT HEART CATH  AND CORONARY ANGIOGRAPHY;  Surgeon: Antonieta Iba, MD;  Location: [facility] INVASIVE CV LAB;  Service: Cardiovascular;  Laterality: N/A;   MAXILLARY ANTROSTOMY Bilateral 11/10/2016   Procedure: MAXILLARY ANTROSTOMY WITH TISSUE REMOVAL;  Surgeon: Geanie Logan, MD;  Location: [facility]GERY [facility];  Service: ENT;  Laterality: Bilateral;   PVC ABLATION N/A 07/22/2020   Procedure: PVC ABLATION;  Surgeon: Lanier Prude, MD;  Location: [facility] INVASIVE CV LAB;  Service: Cardiovascular;  Laterality: N/A;   SPHENOIDECTOMY Bilateral 11/10/2016   Procedure: SPHENOIDECTOMY;  Surgeon: Geanie Logan, MD;  Location: [facility] SURGERY CNTR;  Service: ENT;  Laterality: Bilateral;   Patient Active Problem List   Diagnosis Date Noted   Mild intermittent asthma 07/23/2022   Chronic bilateral low back pain without sciatica 07/23/2022   Ventricular tachyarrhythmia (HCC) 12/09/2020   Ventricular tachycardia (HCC) 07/22/2020   Angina pectoris (HCC) 05/30/2020   VT (ventricular tachycardia) (HCC) 05/30/2020   Near syncope 05/30/2020    PCP: Sherlyn Hay, DO  REFERRING PROVIDER: Sherlyn Hay, DO  REFERRING DIAG: chronic bilateral low back pain without sciatica, chronic pain of both shoulders, bilateral chronic knee pain  Rationale for Evaluation and Treatment: Rehabilitation  THERAPY DIAG:  Chronic pain of both knees  Other low back pain  Bilateral shoulder pain, unspecified chronicity  ONSET  DATE: knee pain and back pain in his 25s, chronic  PERTINENT HISTORY:  Patient is a 62 y.o. male who presents to outpatient physical therapy with a referral for medical diagnosis chronic bilateral low back pain without sciatica, chronic pain of both shoulders, bilateral chronic knee pain. This patient's chief complaints consist of bilateral chronic knee pain, chronic low back pain, and bilateral shoulder pain leading to the following functional deficits: difficulty with jogging, running, squatting, stairs, lunges,  exercise, working. Relevant past medical history and comorbidities include Angina pectoris (HCC); VT (ventricular tachycardia) (HCC); Near syncope; Ventricular tachycardia (HCC); Ventricular tachyarrhythmia (HCC); Mild intermittent asthma; basal cell carcinoma, and Chronic bilateral low back pain without sciatica on their problem list. He has a past medical history of Allergy, Arthritis, Asthma, Colon polyp, Skin cancer (04/13/2016), SVT (supraventricular tachycardia), and Wears contact lenses. He  has a past surgical history that includes Colonoscopy; Image guided sinus surgery (N/A, 11/10/2016); Maxillary antrostomy (Bilateral, 11/10/2016); Ethmoidectomy (Bilateral, 11/10/2016); Frontal sinus exploration (Bilateral, 11/10/2016); Sphenoidectomy (Bilateral, 11/10/2016); LEFT HEART CATH AND CORONARY ANGIOGRAPHY (N/A, 05/30/2020); PVC ABLATION (N/A, 07/22/2020); Cardiac catheterization; Colonoscopy; and Colonoscopy with propofol (N/A, 04/16/2022). Patient denies hx of stroke, seizures, diabetes, unexplained weight loss, unexplained changes in bowel or bladder problems, unexplained stumbling or dropping things, osteoporosis, and spinal surgery  SUBJECTIVE:                                                                                                                                                                                           SUBJECTIVE STATEMENT:   Left shoulder having no issues. Right shoulder has been painful with curls (15#), about the same as last time.  HEP has been going well and has not changed since last week. 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   PAIN: NPRS: 0/10  PRECAUTIONS: None  PATIENT GOALS: "hoping to get some extra exercises to add to his routine" "maybe I can get a little improvement" His next short term goal is to jog the whole mile loop without walking.    OBJECTIVE  SELF-REPORTED FUNCTION FOTO score: 77/100 (knee questionnaire)   TODAY'S TREATMENT:  Therapeutic exercise: to centralize symptoms and improve ROM, strength, muscular endurance, and activity tolerance required for successful completion of functional activities.   - Upper body ergometer level 9 to encourage joint nutrition, warm tissue, induce analgesic effect of aerobic exercise, improve muscular strength and endurance,  and prepare for remainder of session. 5 min forwards/backwards.    Arnold press x10 with 5#, x10 with 7 lbs, x10 with  8 lbs  Front raise thumb up x10 with 5 lbs (first 5 combined with arnold), x10 with 8 lbs, x10 with 8 lbs combined with arnold press  Supinated Lat pulldowns x12 with 65 lbs, 3x10 with 75 lbs (last set with closed grip)  bicep curls x10 with 15 lbs (told to continue at home)  Push ups x10 WG traditional, x10 with arms at 45 degrees, on knees    Pt required multimodal cuing for proper technique and to facilitate improved neuromuscular control, strength, range of motion, and functional ability resulting in improved performance and form.  PATIENT EDUCATION:  Education details: Exercise purpose/form. Self management techniques.  Reviewed cancelation/no-show policy with patient and confirmed patient has phone number for clinic; patient verbalized understanding (08/20/22). Person educated: Patient Education method: Explanation, Demonstration, Tactile cues, and Verbal cues Education comprehension: verbalized understanding, returned demonstration, and needs further education  HOME EXERCISE PROGRAM:  Access Code: [email or web address] URL: [email or web address] Date: 12/03/2022 Prepared by: Norton Blizzard  Exercises - Forearm Walks on Wall with Resistance Band  - 3-4 x weekly - 3 sets - 10 reps - Lat Pull Down - Cable/Bar  - 3 x weekly - 3 sets - 10 reps - 1 second hold - 75 lbs weight - Kneeling Push Up  - 3 x weekly - 3 sets - 10 reps -  Standing Bicep Curls with Dumbbells and Rotation  - 3 x weekly - 3 sets - 8-12 reps - Shoulder Abduction with Dumbbells - Palms Down  - 3 x weekly - 3 sets - 8-12 reps  ASSESSMENT:  CLINICAL IMPRESSION:   Patient arrives without pain today and felt good after dropping his bicep curls at home from 20 lbs to 15 lbs.  Today's focus was on continued shoulder strengthening exercises, adding more compound movements, and further refining the intensity and rep-ranges to align with ACSM guidelines for strength training.  Patient was able to tolerate increased weight for all of his exercises without pain.  Some exercises were able to be combined (such as the front raise, dumbbell ER, and arnold press) to allow more exercises to be done in less time.  He experienced slight shoulder pain during the supinated lat pulldowns, but that was alleviated by using a closed-grip position on the bar.  A new HEP was created for him at the end of the session with progression recommendations for him to use until his next appointment in January. Patient would benefit from continued management of limiting condition by skilled physical therapist to address remaining impairments and functional limitations to work towards stated goals and return to PLOF or maximal functional independence.    From initial PT evaluation 08/20/2022:  Patient is a 62 y.o. male referred to outpatient physical therapy with a medical diagnosis of chronic bilateral low back pain without sciatica, chronic pain of both shoulders, bilateral chronic knee pain who presents with signs and symptoms consistent with chronic bilateral knee pain, L > R, chronic low back pain, and chronic bilaterally shoulder pain. Today's session focused on knee pain per patient request. 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 Patient also TTP at left patellar tendon and with positive patellar grind test on the right. He demonstrates heavy heel strike in jogging form with mild dynamic valgus on the left and likely overstride bilaterally. He has some knee discomfort bilaterally with squatting and lunging  with ipsilateral anterior hip pain with lunging. He demonstrates weight shift posteriorly and to the right with bilateral squats and unsteadiness on feet and limited depth with single leg squats. Any of these objective features could contribute to the difficulty patient is having completing his desired activities without pain and limitation. Lumbar spine screen demonstrated no evidence of nerve root or upper motor neuron involvement. Shoulder examination deferred to later date. Patient presents with significant pain, balance, motor control, edema, running form, squat form, muscle performance (strength/power/endurance), and activity tolerance impairments that are limiting ability to complete desired activities such as jogging, running, squatting, stairs, lunges, exercise, working without difficulty. Patient will benefit from skilled physical therapy intervention to address current body structure impairments and activity limitations to improve function and work towards goals set in current POC in order to return to prior level of function or maximal functional improvement.   OBJECTIVE IMPAIRMENTS: decreased activity tolerance, decreased balance, decreased coordination, decreased endurance, decreased knowledge of condition, decreased strength, increased fascial restrictions, impaired perceived functional ability, increased muscle spasms, impaired UE functional use, improper body mechanics, and pain.   ACTIVITY LIMITATIONS: squatting and stairs  PARTICIPATION LIMITATIONS: community activity, occupation, and   difficulty with jogging, running, squatting, stairs, lunges, exercise, working  PERSONAL FACTORS: Time since  onset of injury/illness/exacerbation and 3+ comorbidities:   Angina pectoris (HCC); VT (ventricular tachycardia) (HCC); Near syncope; Ventricular tachycardia (HCC); Ventricular tachyarrhythmia (HCC); Mild intermittent asthma; basal cell carcinoma, and Chronic bilateral low back pain without sciatica on their problem list. He has a past medical history of Allergy, Arthritis, Asthma, Colon polyp, Skin cancer (04/13/2016), SVT (supraventricular tachycardia), and Wears contact lenses. He  has a past surgical history that includes Colonoscopy; Image guided sinus surgery (N/A, 11/10/2016); Maxillary antrostomy (Bilateral, 11/10/2016); Ethmoidectomy (Bilateral, 11/10/2016); Frontal sinus exploration (Bilateral, 11/10/2016); Sphenoidectomy (Bilateral, 11/10/2016); LEFT HEART CATH AND CORONARY ANGIOGRAPHY (N/A, 05/30/2020); PVC ABLATION (N/A, 07/22/2020); Cardiac catheterization; Colonoscopy; and Colonoscopy with propofol (N/A, 04/16/2022) are also affecting patient's functional outcome.   REHAB POTENTIAL: Excellent  CLINICAL DECISION MAKING: Stable/uncomplicated  EVALUATION COMPLEXITY: Low   GOALS: Goals reviewed with patient? No  SHORT TERM GOALS: Target date: 09/03/2022  Patient will be independent with initial home exercise program for self-management of symptoms. Baseline: Initial HEP to be provided at visit 2 as appropriate (08/20/22); Goal status: MET   LONG TERM GOALS: Target date: 11/12/2022. Target date updated to 02/11/2023 for all unmet goals on 11/19/2022.   Patient will be independent with a long-term home exercise program for self-management of symptoms.  Baseline: Initial HEP to be provided at visit 2 as appropriate (08/20/22); has not been participating the last 2 weeks after heart procedure, participating well prior to that, now updated to include shoulder exercises (11/19/2022);  Goal status: In-progress  2.  Patient will demonstrate improved FOTO by equal or greater than 10 points by  visit #10 to demonstrate improvement in overall condition and self-reported functional ability.  Baseline: 67 (08/20/22); 67 at visit #7 (11/19/2022); 77 at visit #9 (12/03/2022);  Goal status: ongoing  3.  Patient will demonstrate the ability to perform single leg squat to touch buttocks on 18 inch chair with good form and no increase in pain afterwards to improve his ability to run, complete stairs, and squat for exercise.  Baseline: Depth to 45 degrees, bilateral unsteadiness, L > R, Left valgus, right patellar pain (08/20/22); not tested in leu of UE testing (11/19/2022);  Goal status: ongoing  4.  Patient will report being able to  jog during his 1 mile walk/jog without increased pain in his knees. Baseline: pain with initial steps and sometimes the entire bout of jogging (08/20/22); getting better prior to heart procedure, felt good this morning (11/19/2022);  Goal status: In-progress  5.  Patient will complete community, work and/or recreational activities without limitation due to current condition.  Baseline: difficulty with jogging, running, squatting, stairs, lunges, exercise, working (08/20/22); 25% improvement in legs (11/19/2022);  Goal status: In-progress   PLAN:  PT FREQUENCY: 1-2x/week  PT DURATION: 12 weeks  PLANNED INTERVENTIONS: Therapeutic exercises, Therapeutic activity, Neuromuscular re-education, Balance training, Gait training, Patient/Family education, Self Care, Joint mobilization, Stair training, Dry Needling, Electrical stimulation, Spinal mobilization, Cryotherapy, Moist heat, Manual therapy, and Re-evaluation.  PLAN FOR NEXT SESSION: Re-Evaluation in January after [address]    [place]   [place], [place], DPT 12/03/2022, 7:37 PM  [facility] [address] [place], [place] [address] P: [phone] I F: [phone]

## 2023-01-08 ENCOUNTER — Other Ambulatory Visit: Payer: Self-pay | Admitting: Family Medicine

## 2023-01-08 DIAGNOSIS — N529 Male erectile dysfunction, unspecified: Secondary | ICD-10-CM

## 2023-01-08 NOTE — Telephone Encounter (Signed)
Medication Refill -  Most Recent Primary Care Visit:  Provider: Sherlyn Hay  Department: [facility]  Visit Type: NEW PATIENT  Date: 07/23/2022  Medication: sildenafil (VIAGRA) 50 MG tablet   Has the patient contacted their pharmacy? Yes   Is this the correct pharmacy for this prescription? Yes If no, delete pharmacy and type the correct one.  This is the patient's preferred pharmacy:  [facility] [address] ([address]), [place] - [address] ([address]) [place] [address] Phone: [phone] Fax: [phone]  [id number] [place], [place] - [address] [place] [address] Phone: [phone] Fax: [phone]  [id number] [place], [place] - [address] AT [facility] OF [address] [facility] [address] [place] [address] Phone: [phone] Fax: [phone]   Has the prescription been filled recently? Yes  Is the patient out of the medication? Yes  Has the patient been seen for an appointment in the last year OR does the patient have an upcoming appointment? Yes  Can we respond through MyChart? Yes  Agent: Please be advised that Rx refills may take up to 3 business days. We ask that you follow-up with your pharmacy.

## 2023-01-13 NOTE — Telephone Encounter (Signed)
 Requested medication (s) are due for refill today: ?  Requested medication (s) are on the active medication list: historical med  Last refill:  03/13/20  Future visit scheduled: no  Notes to clinic:  historical provider   Requested Prescriptions  Pending Prescriptions Disp Refills   sildenafil  (VIAGRA ) 50 MG tablet 10 tablet     Sig: Take 1 tablet (50 mg total) by mouth as needed for erectile dysfunction.     Urology: Erectile Dysfunction Agents Passed - 01/13/2023  9:53 AM      Passed - AST in normal range and within 360 days    AST  Date Value Ref Range Status  07/30/2022 17 0 - 40 IU/L Final         Passed - ALT in normal range and within 360 days    ALT  Date Value Ref Range Status  07/30/2022 16 0 - 44 IU/L Final         Passed - Last BP in normal range    BP Readings from Last 1 Encounters:  08/27/22 120/84         Passed - Valid encounter within last 12 months    Recent Outpatient Visits           5 months ago Annual physical exam   [facility] [place], Lauraine SAILOR, DO

## 2023-01-14 MED ORDER — SILDENAFIL CITRATE 50 MG PO TABS
50.0000 mg | ORAL_TABLET | Freq: Every day | ORAL | 1 refills | Status: DC | PRN
Start: 2023-01-14 — End: 2023-01-20

## 2023-01-20 ENCOUNTER — Other Ambulatory Visit: Payer: Self-pay | Admitting: Family Medicine

## 2023-01-20 DIAGNOSIS — N529 Male erectile dysfunction, unspecified: Secondary | ICD-10-CM

## 2023-01-20 NOTE — Telephone Encounter (Signed)
 Medication Refill -  Most Recent Primary Care Visit:  Provider: DONZELLA LAURAINE SAILOR  Department: [facility]  Visit Type: NEW PATIENT  Date: 07/23/2022  Medication: sildenafil  (VIAGRA ) 50 MG tablet   (Please increase to 20 pills it is cheaper)   Has the patient contacted their pharmacy? No (Agent: If no, request that the patient contact the pharmacy for the refill. If patient does not wish to contact the pharmacy document the reason why and proceed with request.) (Agent: If yes, when and what did the pharmacy advise?)  Is this the correct pharmacy for this prescription? Yes If no, delete pharmacy and type the correct one.  This is the patient's preferred pharmacy:  [facility] [address] ([address]), [place] - [address] [place]) [place] [address] Phone: [phone] Fax: [phone]    Has the prescription been filled recently? Yes  Is the patient out of the medication? No  Has the patient been seen for an appointment in the last year OR does the patient have an upcoming appointment? Yes  Can we respond through MyChart? Yes  Agent: Please be advised that Rx refills may take up to 3 business days. We ask that you follow-up with your pharmacy.

## 2023-01-21 ENCOUNTER — Ambulatory Visit: Payer: No Typology Code available for payment source | Attending: Family Medicine | Admitting: Physical Therapy

## 2023-01-22 NOTE — Telephone Encounter (Signed)
 Requested medication (s) are due for refill today: Yes  Requested medication (s) are on the active medication list: Yes  Last refill:  01/14/23  Future visit scheduled: Yes  Notes to clinic:  Sending for provider review, patient request quantity of 20 pills since it's cheaper.     Requested Prescriptions  Pending Prescriptions Disp Refills   sildenafil  (VIAGRA ) 50 MG tablet 10 tablet 1    Sig: Take 1 tablet (50 mg total) by mouth daily as needed for erectile dysfunction.     Urology: Erectile Dysfunction Agents Passed - 01/22/2023  7:32 PM      Passed - AST in normal range and within 360 days    AST  Date Value Ref Range Status  07/30/2022 17 0 - 40 IU/L Final         Passed - ALT in normal range and within 360 days    ALT  Date Value Ref Range Status  07/30/2022 16 0 - 44 IU/L Final         Passed - Last BP in normal range    BP Readings from Last 1 Encounters:  08/27/22 120/84         Passed - Valid encounter within last 12 months    Recent Outpatient Visits           6 months ago Annual physical exam   [facility] [place], Lauraine SAILOR, DO       Future Appointments             In 6 months [place], Lauraine SAILOR, DO [place] [facility]

## 2023-01-25 MED ORDER — SILDENAFIL CITRATE 50 MG PO TABS
50.0000 mg | ORAL_TABLET | Freq: Every day | ORAL | 1 refills | Status: AC | PRN
Start: 2023-01-25 — End: ?

## 2023-01-28 ENCOUNTER — Encounter: Payer: No Typology Code available for payment source | Admitting: Physical Therapy

## 2023-02-04 ENCOUNTER — Encounter: Payer: No Typology Code available for payment source | Admitting: Physical Therapy

## 2023-02-11 ENCOUNTER — Encounter: Payer: No Typology Code available for payment source | Admitting: Physical Therapy

## 2023-02-18 ENCOUNTER — Encounter: Payer: No Typology Code available for payment source | Admitting: Physical Therapy

## 2023-02-21 ENCOUNTER — Other Ambulatory Visit: Payer: Self-pay | Admitting: Cardiology

## 2023-02-22 NOTE — Telephone Encounter (Signed)
 Please advise for Magnesium  refill.

## 2023-02-25 ENCOUNTER — Encounter: Payer: No Typology Code available for payment source | Admitting: Physical Therapy

## 2023-03-04 ENCOUNTER — Encounter: Payer: No Typology Code available for payment source | Admitting: Physical Therapy

## 2023-03-11 ENCOUNTER — Encounter: Payer: No Typology Code available for payment source | Admitting: Physical Therapy

## 2023-05-20 ENCOUNTER — Other Ambulatory Visit: Payer: Self-pay | Admitting: Cardiology

## 2023-06-27 ENCOUNTER — Other Ambulatory Visit: Payer: Self-pay | Admitting: Cardiology

## 2023-07-29 ENCOUNTER — Encounter: Payer: Self-pay | Admitting: Family Medicine

## 2023-08-22 ENCOUNTER — Other Ambulatory Visit: Payer: Self-pay | Admitting: Cardiology

## 2023-09-28 ENCOUNTER — Other Ambulatory Visit: Payer: Self-pay | Admitting: Cardiovascular Disease

## 2023-10-25 ENCOUNTER — Other Ambulatory Visit: Payer: Self-pay | Admitting: Cardiovascular Disease

## 2023-11-28 ENCOUNTER — Other Ambulatory Visit: Payer: Self-pay | Admitting: Cardiology
